# Patient Record
Sex: Female | Born: 1965 | Race: White | Hispanic: No | Smoking: Former smoker
Health system: Southern US, Community
[De-identification: ages and names within clinical notes are randomized; demographics above are authoritative.]

## PROBLEM LIST (undated history)

## (undated) DIAGNOSIS — J45909 Unspecified asthma, uncomplicated: Secondary | ICD-10-CM

## (undated) DIAGNOSIS — F419 Anxiety disorder, unspecified: Secondary | ICD-10-CM

## (undated) DIAGNOSIS — Z860100 Personal history of colon polyps, unspecified: Secondary | ICD-10-CM

## (undated) DIAGNOSIS — D649 Anemia, unspecified: Secondary | ICD-10-CM

## (undated) DIAGNOSIS — K219 Gastro-esophageal reflux disease without esophagitis: Secondary | ICD-10-CM

## (undated) DIAGNOSIS — F32A Depression, unspecified: Secondary | ICD-10-CM

## (undated) DIAGNOSIS — K602 Anal fissure, unspecified: Secondary | ICD-10-CM

## (undated) DIAGNOSIS — F329 Major depressive disorder, single episode, unspecified: Secondary | ICD-10-CM

## (undated) DIAGNOSIS — Z8619 Personal history of other infectious and parasitic diseases: Secondary | ICD-10-CM

## (undated) HISTORY — DX: Personal history of colonic polyps: Z86.010

## (undated) HISTORY — DX: Gastro-esophageal reflux disease without esophagitis: K21.9

## (undated) HISTORY — DX: Personal history of colon polyps, unspecified: Z86.0100

## (undated) HISTORY — DX: Major depressive disorder, single episode, unspecified: F32.9

## (undated) HISTORY — DX: Unspecified asthma, uncomplicated: J45.909

## (undated) HISTORY — DX: Anemia, unspecified: D64.9

## (undated) HISTORY — DX: Depression, unspecified: F32.A

## (undated) HISTORY — DX: Anal fissure, unspecified: K60.2

## (undated) HISTORY — DX: Personal history of other infectious and parasitic diseases: Z86.19

## (undated) HISTORY — DX: Anxiety disorder, unspecified: F41.9

## (undated) HISTORY — PX: TONSILLECTOMY AND ADENOIDECTOMY: SHX28

## (undated) HISTORY — PX: CERVICAL POLYPECTOMY: SHX88

---

## 2012-05-04 HISTORY — PX: HEMANGIOMA EXCISION: SHX1734

## 2017-09-17 DIAGNOSIS — Z860100 Personal history of colon polyps, unspecified: Secondary | ICD-10-CM | POA: Insufficient documentation

## 2017-09-17 HISTORY — DX: Personal history of colon polyps, unspecified: Z86.0100

## 2017-11-20 ENCOUNTER — Ambulatory Visit: Payer: Self-pay | Admitting: Family Medicine

## 2017-11-21 ENCOUNTER — Encounter: Payer: Self-pay | Admitting: Family Medicine

## 2017-11-21 ENCOUNTER — Ambulatory Visit (INDEPENDENT_AMBULATORY_CARE_PROVIDER_SITE_OTHER): Payer: BC Managed Care – PPO

## 2017-11-21 ENCOUNTER — Ambulatory Visit: Payer: BC Managed Care – PPO | Admitting: Family Medicine

## 2017-11-21 VITALS — BP 120/84 | HR 93 | Temp 98.4°F | Resp 12 | Wt 144.4 lb

## 2017-11-21 DIAGNOSIS — Z111 Encounter for screening for respiratory tuberculosis: Secondary | ICD-10-CM

## 2017-11-21 DIAGNOSIS — R05 Cough: Secondary | ICD-10-CM | POA: Diagnosis not present

## 2017-11-21 DIAGNOSIS — J309 Allergic rhinitis, unspecified: Secondary | ICD-10-CM

## 2017-11-21 DIAGNOSIS — R053 Chronic cough: Secondary | ICD-10-CM

## 2017-11-21 DIAGNOSIS — F419 Anxiety disorder, unspecified: Secondary | ICD-10-CM

## 2017-11-21 MED ORDER — ALBUTEROL SULFATE HFA 108 (90 BASE) MCG/ACT IN AERS: 2.0000 | INHALATION_SPRAY | Freq: Four times a day (QID) | RESPIRATORY_TRACT | 0 refills | Status: DC | PRN

## 2017-11-21 MED ORDER — BENZONATATE 100 MG PO CAPS: 200.0000 mg | ORAL_CAPSULE | Freq: Three times a day (TID) | ORAL | 0 refills | Status: AC | PRN

## 2017-11-21 MED ORDER — PREDNISONE 20 MG PO TABS: 40.0000 mg | ORAL_TABLET | Freq: Every day | ORAL | 0 refills | Status: AC

## 2017-11-21 MED ORDER — HYDROXYZINE HCL 25 MG PO TABS: 25.0000 mg | ORAL_TABLET | Freq: Three times a day (TID) | ORAL | 1 refills | Status: DC | PRN

## 2017-11-21 NOTE — Patient Instructions (Addendum)
A few things to remember from today's visit:   Persistent cough - Plan: CBC with Differential/Platelet, DG Chest 2 View  Screening-pulmonary TB - Plan: QuantiFERON-TB Gold Plus  Nasal irrigation with saline. Stop Claritin and try Zyrtec 10 mg daily.  Albuterol inh 2 puff every 6 hours for a week then as needed for wheezing or shortness of breath.   Please be sure medication list is accurate. If a new problem present, please set up appointment sooner than planned today.

## 2017-11-21 NOTE — Progress Notes (Signed)
ACUTE VISIT  HPI:  Chief Complaint  Patient presents with  . Sinus drainage    started several months ago,  getting worse  . Cough    with clear phlem  . Panic Attack    having more frequently since moving    Mary Jimenez is a 52 y.o.female here today complaining of 5-6 months of respiratory symptoms. Symptoms started while she was in [place].  Cough is productive with clear sputum, initially it was worse in the morning but now she is coughing intermittently during the day. No associated dyspnea or wheezing but she feels her chest tightness. She denies hemoptysis, abnormal weight loss, fever, chills, or night sweats. Post tussive vomiting, mucus content. She feels like symptoms are exacerbated by postnasal drainage and sinus congestion.  Reporting vaccines as up-to-date.  She had history of asthma during childhood. Currently she is taking Claritin 10 mg daily and Flonase nasal spray.  Vaccines up to date.   Cough   This is a new problem. The current episode started more than 1 month ago. The problem has been unchanged. The problem occurs every few minutes. The cough is productive of sputum. Associated symptoms include nasal congestion, postnasal drip and rhinorrhea. Pertinent negatives include no chest pain, chills, ear congestion, ear pain, eye redness, fever, headaches, heartburn, hemoptysis, myalgias, rash, sore throat, shortness of breath, sweats, weight loss or wheezing. Risk factors for lung disease include travel. Her past medical history is significant for asthma and environmental allergies.    She has not identified exacerbating or alleviating factors.    No Hx of recent travel.  She returned from [place] about 5 months ago No sick contact. No known insect bite.  She is also complaining about worsening anxiety and insomnia. Episodes of panic attack usually in the morning, she feels palpitations and chest tightness, symptoms are better as the day  goes. She denies suicidal thoughts. She has been under some stress since she came back from [place]. Currently she is on trazodone. She is requesting something she can take as needed for panic attacks.  She needs a TB test done.  Review of Systems  Constitutional: Positive for fatigue. Negative for activity change, appetite change, chills, fever and weight loss.  HENT: Positive for congestion, postnasal drip, rhinorrhea and sinus pressure. Negative for ear pain, mouth sores, sneezing, sore throat, trouble swallowing and voice change.   Eyes: Negative for discharge, redness and itching.  Respiratory: Positive for cough and chest tightness. Negative for hemoptysis, shortness of breath and wheezing.   Cardiovascular: Positive for palpitations. Negative for chest pain and leg swelling.  Gastrointestinal: Negative for abdominal pain, diarrhea, heartburn, nausea and vomiting.  Genitourinary: Negative for decreased urine volume, dysuria and hematuria.  Musculoskeletal: Negative for joint swelling, myalgias and neck pain.  Skin: Negative for rash.  Allergic/Immunologic: Positive for environmental allergies.  Neurological: Negative for weakness, numbness and headaches.  Hematological: Negative for adenopathy.  Psychiatric/Behavioral: Positive for sleep disturbance. Negative for confusion and suicidal ideas. The patient is nervous/anxious.       Current Outpatient Medications on File Prior to Visit  Medication Sig Dispense Refill  . estradiol (CLIMARA - DOSED IN MG/24 HR) 0.025 mg/24hr patch Place 0.025 mg onto the skin 2 (two) times a week.    . fluticasone (FLONASE) 50 MCG/ACT nasal spray Place 1 spray into both nostrils daily.    Marland Kitchen. loratadine (CLARITIN) 10 MG tablet Take 10 mg by mouth daily.    .Marland Kitchen  progesterone (PROMETRIUM) 100 MG capsule Take 100 mg by mouth daily.    . traZODone (DESYREL) 50 MG tablet Take 50 mg by mouth at bedtime.     No current facility-administered medications on file  prior to visit.      Past Medical History:  Diagnosis Date  . Anxiety   . Asthma   . Depression   . GERD (gastroesophageal reflux disease)   . History of chicken pox   . History of colon polyps    Allergies  Allergen Reactions  . Pseudoephedrine Other (See Comments)    Actifed caused hallucinations as a child    Social History   Socioeconomic History  . Marital status: Married    Spouse name: Not on file  . Number of children: 2  . Years of education: Not on file  . Highest education level: Not on file  Occupational History  . Not on file  Social Needs  . Financial resource strain: Not on file  . Food insecurity:    Worry: Not on file    Inability: Not on file  . Transportation needs:    Medical: Not on file    Non-medical: Not on file  Tobacco Use  . Smoking status: Former Games developer  . Smokeless tobacco: Never Used  Substance and Sexual Activity  . Alcohol use: Yes  . Drug use: Not Currently  . Sexual activity: Yes  Lifestyle  . Physical activity:    Days per week: Not on file    Minutes per session: Not on file  . Stress: Not on file  Relationships  . Social connections:    Talks on phone: Not on file    Gets together: Not on file    Attends religious service: Not on file    Active member of club or organization: Not on file    Attends meetings of clubs or organizations: Not on file    Relationship status: Not on file  Other Topics Concern  . Not on file  Social History Narrative  . Not on file    Vitals:   11/21/17 1608  BP: 120/84  Pulse: 93  Resp: 12  Temp: 98.4 F (36.9 C)  SpO2: 100%   There is no height or weight on file to calculate BMI.   Physical Exam  Nursing note and vitals reviewed. Constitutional: She is oriented to person, place, and time. She appears well-developed. She does not appear ill. No distress.  HENT:  Head: Normocephalic and atraumatic.  Right Ear: Tympanic membrane, external ear and ear canal normal.  Left Ear:  Tympanic membrane, external ear and ear canal normal.  Nose: Rhinorrhea present. Right sinus exhibits no maxillary sinus tenderness and no frontal sinus tenderness. Left sinus exhibits no maxillary sinus tenderness and no frontal sinus tenderness.  Mouth/Throat: Oropharynx is clear and moist and mucous membranes are normal.  Hypertrophic turbinates. Postnasal drainage.  Eyes: Conjunctivae are normal.  Neck: No muscular tenderness present. No edema and no erythema present.  Cardiovascular: Normal rate and regular rhythm.  No murmur heard. Respiratory: Effort normal and breath sounds normal. No stridor. No respiratory distress.  Lymphadenopathy:       Head (right side): No submandibular adenopathy present.       Head (left side): No submandibular adenopathy present.    She has no cervical adenopathy.  Neurological: She is alert and oriented to person, place, and time. She has normal strength.  Skin: Skin is warm. No rash noted. No erythema.  Psychiatric:  Her mood appears anxious. Her affect is labile. She expresses no suicidal ideation.  Well groomed, good eye contact.    ASSESSMENT AND PLAN:   Ms. Daisy was seen today for sinus drainage, cough and panic attack.  Diagnoses and all orders for this visit:lastcb Lab Results  Component Value Date   WBC 5.3 11/21/2017   HGB 14.0 11/21/2017   HCT 41.4 11/21/2017   MCV 90.6 11/21/2017   PLT 218.0 11/21/2017    Persistent cough  We discussed possible etiologies, including allergies, infectious, and GERD among some. She is not interested in trying empiric antibiotic treatment. Benzonatate may help with cough. Because history of asthma, even though lung auscultation is negative, I recommend Albuterol inh 2 puff every 6 hours for a week then as needed for wheezing or shortness of breath.  Follow-up in 2 to 3 weeks.  -     CBC with Differential/Platelet -     DG Chest 2 View; Future -     benzonatate (TESSALON) 100 MG capsule; Take 2  capsules (200 mg total) by mouth 3 (three) times daily as needed for up to 10 days. -     albuterol (PROVENTIL HFA;VENTOLIN HFA) 108 (90 Base) MCG/ACT inhaler; Inhale 2 puffs into the lungs every 6 (six) hours as needed for wheezing or shortness of breath.  Screening-pulmonary TB -     QuantiFERON-TB Gold Plus  Anxiety disorder, unspecified type  We discussed side effects of hydroxyzine. For now no changes in trazodone. Information about [facility], recommend arranging appointment with psychotherapist.  -     hydrOXYzine (ATARAX/VISTARIL) 25 MG tablet; Take 1 tablet (25 mg total) by mouth 3 (three) times daily as needed for itching.  Allergic rhinitis, unspecified seasonality, unspecified trigger  Nasal sinus congestion could be contributing to cough. Short course of prednisone may help. She understands side effects of medication. Stop Claritin and try Zyrtec 10 mg daily. No changes in Flonase nasal spray. Nasal saline irrigations several times per day as needed.  -     predniSONE (DELTASONE) 20 MG tablet; Take 2 tablets (40 mg total) by mouth daily with breakfast for 5 days.      Teigen Parslow G. [place], MD  [facility]. [place] office.

## 2017-11-22 ENCOUNTER — Telehealth: Payer: Self-pay | Admitting: Family Medicine

## 2017-11-22 LAB — CBC WITH DIFFERENTIAL/PLATELET
BASOS PCT: 0.4 % (ref 0.0–3.0)
Basophils Absolute: 0 10*3/uL (ref 0.0–0.1)
EOS PCT: 1.8 % (ref 0.0–5.0)
Eosinophils Absolute: 0.1 10*3/uL (ref 0.0–0.7)
HCT: 41.4 % (ref 36.0–46.0)
Hemoglobin: 14 g/dL (ref 12.0–15.0)
Lymphocytes Relative: 22.6 % (ref 12.0–46.0)
Lymphs Abs: 1.2 10*3/uL (ref 0.7–4.0)
MCHC: 33.7 g/dL (ref 30.0–36.0)
MCV: 90.6 fl (ref 78.0–100.0)
MONO ABS: 0.5 10*3/uL (ref 0.1–1.0)
Monocytes Relative: 9.9 % (ref 3.0–12.0)
NEUTROS PCT: 65.3 % (ref 43.0–77.0)
Neutro Abs: 3.5 10*3/uL (ref 1.4–7.7)
Platelets: 218 10*3/uL (ref 150.0–400.0)
RBC: 4.56 Mil/uL (ref 3.87–5.11)
RDW: 13 % (ref 11.5–15.5)
WBC: 5.3 10*3/uL (ref 4.0–10.5)

## 2017-11-22 NOTE — Telephone Encounter (Unsigned)
Copied from CRM [id number]. Topic: General - Other >> Nov 22, 2017  4:00 PM Gaynelle AduPoole, Shalonda wrote: Reason for CRM:  patient is calling to state she take traZODone (DESYREL) 50 MG tablet  and she is wanting to know if she needs to stop this medication and start hydrOXYzine (ATARAX/VISTARIL) 25 MG tablet that has been prescribed.  She stated she will wait for a respond correct direction.   Please advise

## 2017-11-23 NOTE — Telephone Encounter (Signed)
Routed to PCP 

## 2017-11-23 NOTE — Telephone Encounter (Signed)
Left detailed message informing pt of update. 

## 2017-11-23 NOTE — Telephone Encounter (Signed)
Trazodone and hydroxyzine may cause drowsiness. So even though there is not major risk of interaction, she needs to be cautious because some side effects are similar.  She is not supposed to drive or engage in activities that increase her risk for injury when taking these medications.  Trazodone is usually taken at bedtime.  Thanks, BJ

## 2017-11-23 NOTE — Telephone Encounter (Signed)
Please advise 

## 2017-11-24 ENCOUNTER — Encounter: Payer: Self-pay | Admitting: Family Medicine

## 2017-11-25 LAB — QUANTIFERON-TB GOLD PLUS
NIL: 0.04 IU/mL
QuantiFERON-TB Gold Plus: NEGATIVE
TB1-NIL: 0.07 IU/mL
TB2-NIL: 0.05 IU/mL

## 2017-11-26 ENCOUNTER — Encounter: Payer: Self-pay | Admitting: Family Medicine

## 2017-12-14 ENCOUNTER — Encounter

## 2017-12-19 ENCOUNTER — Ambulatory Visit: Payer: BC Managed Care – PPO | Admitting: Family Medicine

## 2017-12-19 ENCOUNTER — Encounter: Payer: Self-pay | Admitting: Family Medicine

## 2017-12-19 VITALS — BP 120/70 | HR 89 | Temp 98.6°F | Resp 12 | Wt 151.0 lb

## 2017-12-19 DIAGNOSIS — J3089 Other allergic rhinitis: Secondary | ICD-10-CM | POA: Diagnosis not present

## 2017-12-19 DIAGNOSIS — F419 Anxiety disorder, unspecified: Secondary | ICD-10-CM | POA: Insufficient documentation

## 2017-12-19 DIAGNOSIS — G4700 Insomnia, unspecified: Secondary | ICD-10-CM | POA: Insufficient documentation

## 2017-12-19 DIAGNOSIS — N951 Menopausal and female climacteric states: Secondary | ICD-10-CM | POA: Insufficient documentation

## 2017-12-19 DIAGNOSIS — J309 Allergic rhinitis, unspecified: Secondary | ICD-10-CM | POA: Insufficient documentation

## 2017-12-19 MED ORDER — PROGESTERONE MICRONIZED 100 MG PO CAPS: 100.0000 mg | ORAL_CAPSULE | Freq: Every day | ORAL | 2 refills | Status: DC

## 2017-12-19 MED ORDER — ESTRADIOL 0.025 MG/24HR TD PTWK: 0.0250 mg | MEDICATED_PATCH | TRANSDERMAL | 2 refills | Status: DC

## 2017-12-19 NOTE — Assessment & Plan Note (Signed)
Improved. No changes since Zyrtec 10 mg daily. Continue Flonase nasal spray daily as needed. Saline nasal irrigations several times per day also recommended.

## 2017-12-19 NOTE — Assessment & Plan Note (Signed)
Since she is not sure if she still needs medication, recommend decreasing dose of trazodone from 50 mg daily to alternating between 50 and 25 mg daily for 2 weeks then trazodone 25 mg daily. Good sleep hygiene. I think it is appropriate to follow annually unless a new concern arise.

## 2017-12-19 NOTE — Assessment & Plan Note (Signed)
She feels like she is doing better and not interested in changing or adding medications. She can continue hydroxyzine 25 mg 3 times daily as needed, we discussed some side effects. She will try to decrease trazodone to 25 mg daily and monitor for changes. Instructed about warning signs. Strongly recommend psychotherapy, she is looking into this but she has had some difficulty with insurance coverage. Follow-up in a year, before if needed.

## 2017-12-19 NOTE — Assessment & Plan Note (Signed)
Problem is well controlled with current management. We discussed side effects of hormonal therapy, including thrombotic events. No changes in current management. Follow-up in a year.

## 2017-12-19 NOTE — Progress Notes (Signed)
HPI:   [name] is a 52 y.o. female, who is here today to establish care. I saw her on 11/21/2017 for acute visit.   Former PCP: She just moved from [place] 1 to 2 months ago.  Last preventive routine visit: 09/2017, gynecologic preventive visit.  Chronic medical problems: Allergic rhinitis, anxiety, menopausal hot flashes, insomnia.  Last visit she was complaining of having worsening anxiety and panic attacks. Hydroxyzine 25 mg was recommended, medication helps. Last panic attack was about 10 days ago. She denies depressed mood but she mentions that when she is anxious she does not want to get out of bed, symptoms are alleviated by "sleeping all day."  In the past she has been on Prozac 20 mg, which she took for a year.  She felt like medication was helping but she was feeling like her mood was "flat." Relaxation exercises and regular physical activity as well as a healthy diet have helped.  She lives with her husband. Her daughter and her husband moved to the area, they are expecting and she is excited about helping them with the baby.  She is on trazodone 50 mg daily, it started about 2 years ago for insomnia. Trazodone has also helped with anxiety. She states that she is not sure if she still needs to take trazodone for sleep, she is sleeping well. She denies side effect from medication.  Allergic rhinitis: Symptoms have improved greatly with Zyrtec 10 mg daily.  She discontinued loratadine. She is also using Flonase nasal spray and saline nasal irrigations. She still has some rhinorrhea, nasal congestion, and postnasal drainage. Symptoms are worse in the morning. No fever or chills.  Concerns today: Hormonal therapy refilled. Currently she is on estradiol patch 0.0 25 mg twice per week and progesterone 100 mg daily. She is tolerating medication well. She has been on hormonal treatment for about 2 years.  Last mammogram was 2 months ago,  negative. She denies vaginal bleeding or discharge. Last Pap smear 09/2017. She underwent endometrial biopsy because endometrial cells present in Pap smear.  Pathology negative for malignancy.    Review of Systems  Constitutional: Negative for activity change, appetite change, fatigue and fever.  HENT: Positive for congestion, postnasal drip, rhinorrhea and sinus pressure. Negative for mouth sores, nosebleeds and trouble swallowing.   Eyes: Negative for redness and visual disturbance.  Respiratory: Negative for cough, shortness of breath and wheezing.   Cardiovascular: Negative for chest pain, palpitations and leg swelling.  Gastrointestinal: Negative for abdominal pain, nausea and vomiting.       Negative for changes in bowel habits.  Genitourinary: Negative for decreased urine volume, difficulty urinating, dysuria and hematuria.  Allergic/Immunologic: Positive for environmental allergies.  Neurological: Negative for syncope, weakness and headaches.  Psychiatric/Behavioral: Negative for confusion, sleep disturbance and suicidal ideas. The patient is nervous/anxious.       Current Outpatient Medications on File Prior to Visit  Medication Sig Dispense Refill  . albuterol (PROVENTIL HFA;VENTOLIN HFA) 108 (90 Base) MCG/ACT inhaler Inhale 2 puffs into the lungs every 6 (six) hours as needed for wheezing or shortness of breath. 1 Inhaler 0  . cetirizine (ZYRTEC) 10 MG tablet Take 10 mg by mouth daily.    . fluticasone (FLONASE) 50 MCG/ACT nasal spray Place 1 spray into both nostrils daily.    . traZODone (DESYREL) 50 MG tablet Take 50 mg by mouth at bedtime.     No current facility-administered medications on file prior to visit.  Past Medical History:  Diagnosis Date  . Anxiety   . Asthma   . Depression   . GERD (gastroesophageal reflux disease)   . History of chicken pox   . History of colon polyps    Allergies  Allergen Reactions  . Pseudoephedrine Other (See Comments)     Actifed caused hallucinations as a child    Family History  Problem Relation Age of Onset  . Arthritis Mother   . Asthma Mother   . Depression Mother   . Hypertension Mother   . Asthma Sister   . Hypertension Sister   . Miscarriages / Stillbirths Sister   . Miscarriages / [place] Daughter   . Alcohol abuse Maternal Grandmother   . Cancer Maternal Grandmother   . Cancer Maternal Grandfather     Social History   Socioeconomic History  . Marital status: Married    Spouse name: Not on file  . Number of children: 2  . Years of education: Not on file  . Highest education level: Not on file  Occupational History  . Not on file  Social Needs  . Financial resource strain: Not on file  . Food insecurity:    Worry: Not on file    Inability: Not on file  . Transportation needs:    Medical: Not on file    Non-medical: Not on file  Tobacco Use  . Smoking status: Former Games developer  . Smokeless tobacco: Never Used  Substance and Sexual Activity  . Alcohol use: Yes  . Drug use: Not Currently  . Sexual activity: Yes  Lifestyle  . Physical activity:    Days per week: Not on file    Minutes per session: Not on file  . Stress: Not on file  Relationships  . Social connections:    Talks on phone: Not on file    Gets together: Not on file    Attends religious service: Not on file    Active member of club or organization: Not on file    Attends meetings of clubs or organizations: Not on file    Relationship status: Not on file  Other Topics Concern  . Not on file  Social History Narrative  . Not on file    Vitals:   12/19/17 1127  BP: 120/70  Pulse: 89  Resp: 12  Temp: 98.6 F (37 C)  SpO2: 98%    There is no height or weight on file to calculate BMI.   Physical Exam  Nursing note and vitals reviewed. Constitutional: She is oriented to person, place, and time. She appears well-developed. No distress.  HENT:  Head: Normocephalic and atraumatic.  Nose: Right  sinus exhibits no maxillary sinus tenderness and no frontal sinus tenderness. Left sinus exhibits no maxillary sinus tenderness and no frontal sinus tenderness.  Mouth/Throat: Oropharynx is clear and moist and mucous membranes are normal.  Clear postnasal drainage.  Eyes: Pupils are equal, round, and reactive to light. Conjunctivae are normal.  Cardiovascular: Normal rate and regular rhythm.  No murmur heard. Pulses:      Dorsalis pedis pulses are 2+ on the right side, and 2+ on the left side.  Respiratory: Effort normal and breath sounds normal. No respiratory distress.  GI: Soft. She exhibits no mass. There is no hepatomegaly. There is no tenderness.  Musculoskeletal: She exhibits no edema.  Lymphadenopathy:    She has no cervical adenopathy.  Neurological: She is alert and oriented to person, place, and time. She has normal strength. No  cranial nerve deficit. Gait normal.  Skin: Skin is warm. No rash noted. No erythema.  Psychiatric: Her mood appears anxious.  Well groomed, good eye contact.     ASSESSMENT AND PLAN:  Ms. Sherril was seen today for establish care.  Diagnoses and all orders for this visit:  Insomnia Since she is not sure if she still needs medication, recommend decreasing dose of trazodone from 50 mg daily to alternating between 50 and 25 mg daily for 2 weeks then trazodone 25 mg daily. Good sleep hygiene. I think it is appropriate to follow annually unless a new concern arise.  Anxiety disorder She feels like she is doing better and not interested in changing or adding medications. She can continue hydroxyzine 25 mg 3 times daily as needed, we discussed some side effects. She will try to decrease trazodone to 25 mg daily and monitor for changes. Instructed about warning signs. Strongly recommend psychotherapy, she is looking into this but she has had some difficulty with insurance coverage. Follow-up in a year, before if needed.  Allergic  rhinitis Improved. No changes since Zyrtec 10 mg daily. Continue Flonase nasal spray daily as needed. Saline nasal irrigations several times per day also recommended.  Hot flashes due to menopause Problem is well controlled with current management. We discussed side effects of hormonal therapy, including thrombotic events. No changes in current management. Follow-up in a year.     Alexios Keown G. [place], MD  [facility]. [place] office.

## 2017-12-19 NOTE — Patient Instructions (Addendum)
A few things to remember from today's visit:   Insomnia, unspecified type  Anxiety disorder, unspecified type  Non-seasonal allergic rhinitis, unspecified trigger  Decrease trazodone from 50 mg to 25 mg, alternating for 2 weeks then daily 25 mg. I think it is appropriate to see you annually unless your anxiety gets worse.  Please be sure medication list is accurate. If a new problem present, please set up appointment sooner than planned today.

## 2018-01-15 ENCOUNTER — Ambulatory Visit: Payer: Self-pay | Admitting: *Deleted

## 2018-01-15 NOTE — Telephone Encounter (Signed)
  Reason for Disposition . Postmenopausal vaginal bleeding  Answer Assessment - Initial Assessment Questions 1. AMOUNT: "Describe the bleeding that you are having." "How much bleeding is there?"    - SPOTTING: spotting, or pinkish / brownish mucous discharge; does not fill panti-liner or pad    - MILD:  less than 1 pad / hour; less than patient's  menstrual bleeding when she still had menstrual periods   - MODERATE: 1-2 pads / hour; small-medium blood clots (e.g., pea, grape, small coin)    - SEVERE: soaking 2 or more pads/hour for 2 or more hours; bleeding not contained by pads or continuous red blood from vagina; large blood clots (e.g., golf ball, large coin)      Spotting- red when wipes 2. ONSET: "When did the bleeding begin?" "Is it continuing now?"     Yesterday morning- tinged mucus 3. MENOPAUSE: "When was your last menstrual period?"      2 years ago 4. ABDOMINAL PAIN: "Do you have any pain?" "How bad is the pain?"  (e.g., Scale 1-10; mild, moderate, or severe)   - MILD (1-3): doesn't interfere with normal activities, abdomen soft and not tender to touch    - MODERATE (4-7): interferes with normal activities or awakens from sleep, tender to touch    - SEVERE (8-10): excruciating pain, doubled over, unable to do any normal activities      Tender spot at end of pelvis below belly button 5. BLOOD THINNERS: "Do you take any blood thinners?" (e.g., Coumadin/warfarin, Pradaxa/dabigatran, aspirin)     no 6. HORMONES: "Are you taking any hormone medications, prescription or OTC?" (e.g., birth control pills, estrogen)     estrogen patch and progesterone pills 7. CAUSE: "What do you think is causing the bleeding?" (e.g., recent gyn surgery, recent gyn procedure; known bleeding disorder, uterine cancer)       HRT, change in exercise routine, Hx cervical polyp 8. HEMODYNAMIC STATUS: "Are you weak or feeling lightheaded?" If so, ask: "Can you stand and walk normally?"       no 9. OTHER  SYMPTOMS: "What other symptoms are you having with the bleeding?" (e.g., back pain, burning with urination, fever)     no  Protocols used: VAGINAL BLEEDING - POSTMENOPAUSAL-A-AH

## 2018-01-15 NOTE — Telephone Encounter (Signed)
Noted  

## 2018-01-16 ENCOUNTER — Ambulatory Visit: Payer: BC Managed Care – PPO | Admitting: Family Medicine

## 2018-01-16 ENCOUNTER — Encounter: Payer: Self-pay | Admitting: Family Medicine

## 2018-01-16 VITALS — BP 124/80 | HR 83 | Temp 98.4°F | Resp 12 | Ht 66.0 in | Wt 153.5 lb

## 2018-01-16 DIAGNOSIS — N938 Other specified abnormal uterine and vaginal bleeding: Secondary | ICD-10-CM

## 2018-01-16 DIAGNOSIS — N951 Menopausal and female climacteric states: Secondary | ICD-10-CM | POA: Diagnosis not present

## 2018-01-16 DIAGNOSIS — R635 Abnormal weight gain: Secondary | ICD-10-CM | POA: Diagnosis not present

## 2018-01-16 DIAGNOSIS — R102 Pelvic and perineal pain: Secondary | ICD-10-CM | POA: Diagnosis not present

## 2018-01-16 LAB — CBC
HEMATOCRIT: 40.8 % (ref 36.0–46.0)
HEMOGLOBIN: 13.7 g/dL (ref 12.0–15.0)
MCHC: 33.7 g/dL (ref 30.0–36.0)
MCV: 90.4 fl (ref 78.0–100.0)
Platelets: 194 10*3/uL (ref 150.0–400.0)
RBC: 4.51 Mil/uL (ref 3.87–5.11)
RDW: 13.1 % (ref 11.5–15.5)
WBC: 5 10*3/uL (ref 4.0–10.5)

## 2018-01-16 LAB — TSH: TSH: 3.16 u[IU]/mL (ref 0.35–4.50)

## 2018-01-16 LAB — POCT URINE PREGNANCY: Preg Test, Ur: NEGATIVE

## 2018-01-16 NOTE — Assessment & Plan Note (Signed)
We discussed some side effects of hormonal therapy. She is been taking Prometrium and estradiolvpatch as instructed, has not skipped/missed medications. No changes in current management.

## 2018-01-16 NOTE — Progress Notes (Signed)
ACUTE VISIT   HPI:  Chief Complaint  Patient presents with  . Post menopausal spotting    bleeding x 2 days    Mary Jimenez is a 52 y.o. female, who is here today complaining of 2 to 3 days of vaginal bleeding, gradual onset. Getting worse. Last night she "passed a clot." No history of trauma. She is sexually active, last intercourse about 6 days ago. + Some breast tenderness, she has not noted masses or nipple discharge.  LMP 04/2016.  Mild, suprapubic pain, no radiated, and intermittent. She wonders if suprapubic pain is related to exercising, lifting weights, she has some lower abdominal discomfort after finishing exercise section.   Lower abdominal pain exacerbated by certain movements and palpation, occasionally she also feels a "twitch" around same area. No associated urinary symptoms. Negative for fever, chills, nausea, vomiting,or urinary symptoms. She has not taken OTC analgesic.  A few days ago she had an episode of nosebleed, mild and self-limited.  She denies easy bruising, gross hematuria, or blood in the stool.  She is on progesterone 100 mg daily and estradiol patch 0.0 25 mg twice per week.  She is taking medications as instructed, has not missed any of those and usually changes patch around same time. Hormonal therapy is helping with hot flashes. She denies prior history of vaginal bleeding.  Previously she has undergone endometrial biopsy due to endometrial cells present in Pap smear.  She underwent polypectomy, no complications or problems after the procedure.  She is also complaining about abnormal weight gain despite of regular exercise and a healthful diet.  She has gained about 15 pounds in the past 1 to 2 months. Negative for unusual fatigue. She has some constipation 2 days ago.   Review of Systems  Constitutional: Negative for activity change, appetite change, fatigue and fever.  HENT: Positive for nosebleeds. Negative for mouth  sores.   Eyes: Negative for redness and visual disturbance.  Respiratory: Negative for shortness of breath and wheezing.   Cardiovascular: Negative for leg swelling.  Gastrointestinal: Positive for abdominal pain. Negative for nausea and vomiting.       Negative for changes in bowel habits.  Endocrine: Negative for cold intolerance and heat intolerance.  Genitourinary: Positive for vaginal bleeding. Negative for decreased urine volume, difficulty urinating, dysuria, genital sores, hematuria and vaginal discharge.  Musculoskeletal: Negative for back pain and myalgias.  Skin: Negative for rash and wound.  Neurological: Negative for syncope, weakness and headaches.  Psychiatric/Behavioral: The patient is nervous/anxious.       Current Outpatient Medications on File Prior to Visit  Medication Sig Dispense Refill  . albuterol (PROVENTIL HFA;VENTOLIN HFA) 108 (90 Base) MCG/ACT inhaler Inhale 2 puffs into the lungs every 6 (six) hours as needed for wheezing or shortness of breath. 1 Inhaler 0  . cetirizine (ZYRTEC) 10 MG tablet Take 10 mg by mouth daily.    Marland Kitchen. estradiol (CLIMARA - DOSED IN MG/24 HR) 0.025 mg/24hr patch Place 1 patch (0.025 mg total) onto the skin 2 (two) times a week. 24 patch 2  . fluticasone (FLONASE) 50 MCG/ACT nasal spray Place 1 spray into both nostrils daily.    . progesterone (PROMETRIUM) 100 MG capsule Take 1 capsule (100 mg total) by mouth daily. 90 capsule 2  . traZODone (DESYREL) 50 MG tablet Take 50 mg by mouth at bedtime.     No current facility-administered medications on file prior to visit.      Past Medical  History:  Diagnosis Date  . Anxiety   . Asthma   . Depression   . GERD (gastroesophageal reflux disease)   . History of chicken pox   . History of colon polyps    Allergies  Allergen Reactions  . Pseudoephedrine Other (See Comments)    Actifed caused hallucinations as a child    Social History   Socioeconomic History  . Marital status:  Married    Spouse name: Not on file  . Number of children: 2  . Years of education: Not on file  . Highest education level: Not on file  Occupational History  . Not on file  Social Needs  . Financial resource strain: Not on file  . Food insecurity:    Worry: Not on file    Inability: Not on file  . Transportation needs:    Medical: Not on file    Non-medical: Not on file  Tobacco Use  . Smoking status: Former Games developer  . Smokeless tobacco: Never Used  Substance and Sexual Activity  . Alcohol use: Yes  . Drug use: Not Currently  . Sexual activity: Yes  Lifestyle  . Physical activity:    Days per week: Not on file    Minutes per session: Not on file  . Stress: Not on file  Relationships  . Social connections:    Talks on phone: Not on file    Gets together: Not on file    Attends religious service: Not on file    Active member of club or organization: Not on file    Attends meetings of clubs or organizations: Not on file    Relationship status: Not on file  Other Topics Concern  . Not on file  Social History Narrative  . Not on file    Vitals:   01/16/18 1216  BP: 124/80  Pulse: 83  Resp: 12  Temp: 98.4 F (36.9 C)  SpO2: 98%   Body mass index is 24.78 kg/m.  Wt Readings from Last 3 Encounters:  01/16/18 153 lb 8 oz (69.6 kg)  12/19/17 151 lb (68.5 kg)  11/21/17 144 lb 6 oz (65.5 kg)    Physical Exam  Nursing note and vitals reviewed. Constitutional: She is oriented to person, place, and time. She appears well-developed and well-nourished. She does not appear ill. No distress.  HENT:  Head: Normocephalic and atraumatic.  Mouth/Throat: Oropharynx is clear and moist and mucous membranes are normal.  Eyes: Conjunctivae are normal. No scleral icterus.  Neck: No thyroid mass and no thyromegaly present.  Cardiovascular: Normal rate and regular rhythm.  No murmur heard. Respiratory: Effort normal and breath sounds normal. No respiratory distress.  GI: Soft.  Bowel sounds are normal. She exhibits no distension and no mass. There is no hepatomegaly. There is no tenderness.  Genitourinary:  Genitourinary Comments: Deferred to gynecologist.  Musculoskeletal: She exhibits no edema.  Lymphadenopathy:    She has no cervical adenopathy.       Right: No supraclavicular adenopathy present.       Left: No supraclavicular adenopathy present.  Neurological: She is alert and oriented to person, place, and time. She has normal strength.  Skin: Skin is warm. No rash noted. No erythema.  Psychiatric: Her mood appears anxious.  Well groomed, good eye contact.    ASSESSMENT AND PLAN:  Mary Jimenez was seen today for post menopausal spotting.  Diagnoses and all orders for this visit:  DUB (dysfunctional uterine bleeding) Possible etiology discussed.  Benign problem  as side effect from hormonal therapy or endometrial polyp to a more serious conditions as malignancy need to be considered. Gynecology referral placed, she might need another endometrial biopsy + further work-up. Pelvic examination was not done today, she prefers to have it done at the gynecologist's office.  She was clearly instructed about warning signs. Further recommendation will be given according to lab results.  -     Ambulatory referral to Gynecology -     CBC -     TSH -     POCT urine pregnancy  Suprapubic abdominal pain Abdominal palpation was negative for pain. ?  Abdominal wall pain. Instructed about warning signs.  -     CBC  Weight gain Recommend continuing a healthful diet and regular physical activity. Further recommendation will be given according to lab results.  -     TSH   Hot flashes due to menopause We discussed some side effects of hormonal therapy. She is been taking Prometrium and estradiolvpatch as instructed, has not skipped/missed medications. No changes in current management.    Return if symptoms worsen or fail to improve.      Conan Mcmanaway G.  [place], MD  [facility]. [place] office.

## 2018-01-16 NOTE — Patient Instructions (Signed)
A few things to remember from today's visit:   DUB (dysfunctional uterine bleeding) - Plan: Ambulatory referral to Gynecology, CBC, TSH, POCT urine pregnancy  Suprapubic abdominal pain - Plan: CBC   Abnormal Uterine Bleeding Abnormal uterine bleeding means bleeding more than usual from your uterus. It can include:  Bleeding between periods.  Bleeding after sex.  Bleeding that is heavier than normal.  Periods that last longer than usual.  Bleeding after you have stopped having your period (menopause).  There are many problems that may cause this. You should see a doctor for any kind of bleeding that is not normal. Treatment depends on the cause of the bleeding. Follow these instructions at home:  Watch your condition for any changes.  Do not use tampons, douche, or have sex, if your doctor tells you not to.  Change your pads often.  Get regular well-woman exams. Make sure they include a pelvic exam and cervical cancer screening.  Keep all follow-up visits as told by your doctor. This is important. Contact a doctor if:  The bleeding lasts more than one week.  You feel dizzy at times.  You feel like you are going to throw up (nauseous).  You throw up. Get help right away if:  You pass out.  You have to change pads every hour.  You have belly (abdominal) pain.  You have a fever.  You get sweaty.  You get weak.  You passing large blood clots from your vagina. Summary  Abnormal uterine bleeding means bleeding more than usual from your uterus.  There are many problems that may cause this. You should see a doctor for any kind of bleeding that is not normal.  Treatment depends on the cause of the bleeding. This information is not intended to replace advice given to you by your health care provider. Make sure you discuss any questions you have with your health care provider. Document Released: 12/18/2008 Document Revised: 02/15/2016 Document Reviewed:  02/15/2016 Elsevier Interactive Patient Education  2017 [employer]  Please be sure medication list is accurate. If a new problem present, please set up appointment sooner than planned today.

## 2018-01-24 ENCOUNTER — Encounter: Payer: Self-pay | Admitting: Gynecology

## 2018-01-24 ENCOUNTER — Ambulatory Visit: Payer: BC Managed Care – PPO | Admitting: Gynecology

## 2018-01-24 VITALS — BP 114/74 | Ht 69.0 in | Wt 156.0 lb

## 2018-01-24 DIAGNOSIS — N950 Postmenopausal bleeding: Secondary | ICD-10-CM

## 2018-01-24 DIAGNOSIS — Z79890 Hormone replacement therapy (postmenopausal): Secondary | ICD-10-CM

## 2018-01-24 NOTE — Progress Notes (Signed)
    Mary Jimenez [birth year] [id number]        52 y.o.  [id number] new patient presents complaining of postmenopausal bleeding.  It has been approximately 2 years since her LMP.  Currently on Climara 0.025 patch and Prometrium 100 mg nightly.  Had annual exam in [place] in July where cervical polyp was noted and subsequently biopsied off.  Endometrial biopsy showed benign endometrial tissue with some histiocytes.  The endocervical polyp was benign.  Pap smear at that time was negative.  She was not having bleeding at that time but this was found on routine exam.  Currently up-to-date with mammography and colonoscopy.  Past medical history,surgical history, problem list, medications, allergies, family history and social history were all reviewed and documented in the EPIC chart.  Directed ROS with pertinent positives and negatives documented in the history of present illness/assessment and plan.  Exam: Kennon PortelaKim Gardner assistant Vitals:   01/24/18 0855  Weight: 156 lb (70.8 kg)  Height: 5\' 9"  (1.753 m)   General appearance:  Normal Abdomen soft nontender without masses guarding rebound Pelvic external BUS vagina normal.  Cervix normal.  Uterus normal size midline mobile nontender.  Adnexa without masses or tenderness.  Assessment/Plan:  52 y.o. Mary Jimenez with episode of menses like bleeding on HRT.  Differential to include escape ovulation, hormonal dysfunction, HRT related, hyperplastic, structural such as polyps and uterine cancer all reviewed.  Given total scenario cancer/significant pathology low with biopsy in July negative.  Did not rule out possibility of endometrial polyps.  Recommend starting with sonohysterogram for pelvic surveillance/endometrial assessment.  We also discussed HRT in detail to include various studies including WHI with increased risk of thrombosis such as stroke heart attack DVT in the breast cancer issue.  Benefits to include symptom relief, cardiovascular, bone health  and decrease colon cancer risk all reviewed.  At this point the patient is going to continue on her HRT regiment and follow-up for the sonohysterogram.  I spent a total of 30 face-to-face minutes with the patient, over 50% was spent counseling and coordination of care.     Dara Lordsimothy P Jaxn Chiquito MD, 9:16 AM 01/24/2018

## 2018-01-24 NOTE — Patient Instructions (Signed)
Follow-up for the ultrasound as scheduled. 

## 2018-01-28 ENCOUNTER — Other Ambulatory Visit: Payer: Self-pay | Admitting: Gynecology

## 2018-01-28 DIAGNOSIS — N950 Postmenopausal bleeding: Secondary | ICD-10-CM

## 2018-01-31 ENCOUNTER — Encounter: Payer: Self-pay | Admitting: Gynecology

## 2018-02-05 NOTE — Telephone Encounter (Signed)
The findings on her endometrial biopsy are fairly nonspecific.  My understanding by looking at the chart is she was screened for TB and negative.  I think waiting until the ninth is okay

## 2018-02-11 ENCOUNTER — Ambulatory Visit (INDEPENDENT_AMBULATORY_CARE_PROVIDER_SITE_OTHER): Payer: BC Managed Care – PPO

## 2018-02-11 ENCOUNTER — Encounter: Payer: Self-pay | Admitting: Gynecology

## 2018-02-11 ENCOUNTER — Ambulatory Visit: Payer: BC Managed Care – PPO | Admitting: Gynecology

## 2018-02-11 VITALS — BP 122/80

## 2018-02-11 DIAGNOSIS — N950 Postmenopausal bleeding: Secondary | ICD-10-CM

## 2018-02-11 NOTE — Patient Instructions (Signed)
Office will call you with biopsy results 

## 2018-02-11 NOTE — Progress Notes (Signed)
    Mary Jimenez [birth year] [id number]        52 y.o.  [id number] presents for sonohysterogram.  History of 2 years without menses and then had 2 separate bleeding episodes in November.  On Climara patch 0.025 and Prometrium 100 mg nightly.  Reports that she underwent hormonal testing when she went through menopause several years ago which showed that she was in menopause.  Past medical history,surgical history, problem list, medications, allergies, family history and social history were all reviewed and documented in the EPIC chart.  Directed ROS with pertinent positives and negatives documented in the history of present illness/assessment and plan.  Exam: [name] assistant Vitals:   02/11/18 1633  BP: 122/80   General appearance:  Normal Abdomen soft nontender without masses guarding rebound Pelvic external BUS vagina normal.  Cervix normal.  Uterus normal size midline mobile nontender.  Adnexa without masses or tenderness.  Ultrasound shows uterus normal size and echotexture.  Endometrial echo 5.2 mm.  Right ovary with thick walled involuting type cyst 15 mm.  Thin-walled cyst 22 x 24 mm.  Both areas negative color flow Doppler.  Left ovary normal.  Cul-de-sac with 35 x 12 mm of fluid.  Sonohysterogram performed, sterile technique, easy catheter introduction, good distention with no abnormality seen.  Endometrial biopsy taken.  Patient tolerated well.  Assessment/Plan:  52 y.o. [id number] with 2 episodes of bleeding in November on HRT after no bleeding for about 2 years.  Right ovary with questionable corpus luteal type cyst.  I reviewed with the patient and her husband possibilities to include escape ovulation versus breakthrough bleeding.  She did have histiocytes on an endometrial biopsy done in [place] although was screened for TB and negative.  She will follow-up for biopsy results done today in several days.  We discussed various options to include continuing HRT, stopping for 2  weeks monitoring symptoms and checking FSH or changing regimens.  If indeed in the escape ovulation possibilities of pregnancy also discussed although unlikely.  Patient will follow-up for biopsy results and then will go from there.    Dara Lordsimothy P Laquinta Hazell MD, 4:55 PM 02/11/2018

## 2018-02-15 ENCOUNTER — Encounter: Payer: Self-pay | Admitting: Family Medicine

## 2018-02-18 ENCOUNTER — Other Ambulatory Visit: Payer: Self-pay | Admitting: Family Medicine

## 2018-02-18 MED ORDER — TRAZODONE HCL 50 MG PO TABS: 25.0000 mg | ORAL_TABLET | Freq: Every evening | ORAL | 1 refills | Status: DC | PRN

## 2018-05-02 ENCOUNTER — Encounter: Payer: Self-pay | Admitting: Gynecology

## 2018-05-02 ENCOUNTER — Encounter: Payer: Self-pay | Admitting: Family Medicine

## 2018-05-02 ENCOUNTER — Other Ambulatory Visit: Payer: Self-pay | Admitting: Gynecology

## 2018-05-02 DIAGNOSIS — N951 Menopausal and female climacteric states: Secondary | ICD-10-CM

## 2018-05-02 NOTE — Telephone Encounter (Signed)
Dr. Augustin Schooling saw you in Oct as new patient. Did not appear to be a CE.  Not clear when she should return for CE and if okay to refill this progesterone.

## 2018-05-02 NOTE — Telephone Encounter (Signed)
Okay to refill both estrogen and progesterone through July when she should be due for her annual exam

## 2018-05-03 MED ORDER — PROGESTERONE MICRONIZED 100 MG PO CAPS: 100.0000 mg | ORAL_CAPSULE | Freq: Every day | ORAL | 1 refills | Status: DC

## 2018-05-03 MED ORDER — ESTRADIOL 0.025 MG/24HR TD PTWK: 0.0250 mg | MEDICATED_PATCH | TRANSDERMAL | 1 refills | Status: DC

## 2018-05-19 ENCOUNTER — Encounter: Payer: Self-pay | Admitting: Gynecology

## 2018-05-19 ENCOUNTER — Other Ambulatory Visit: Payer: Self-pay | Admitting: Family Medicine

## 2018-05-19 DIAGNOSIS — R05 Cough: Secondary | ICD-10-CM

## 2018-05-19 DIAGNOSIS — R053 Chronic cough: Secondary | ICD-10-CM

## 2018-05-21 MED ORDER — ALBUTEROL SULFATE HFA 108 (90 BASE) MCG/ACT IN AERS: 2.0000 | INHALATION_SPRAY | Freq: Four times a day (QID) | RESPIRATORY_TRACT | 0 refills | Status: DC | PRN

## 2018-07-03 ENCOUNTER — Encounter: Payer: Self-pay | Admitting: Gynecology

## 2018-07-08 NOTE — Telephone Encounter (Signed)
If her symptoms are not that bothersome I would do nothing at this point.  I see that she has a prescription for trazodone to help with sleep from Dr. [place].  If she feels her symptoms are more significant we could try increasing her estrogen patch to 0.05 from the 0.025 and if the symptoms improved and it was due to lower estrogen.  But again at this point I think if her symptoms are not that significant then I would monitor and see if they do not resolve on their own when she gets back into a more regular routine and the COVID 19 issue lessons.

## 2018-07-31 ENCOUNTER — Other Ambulatory Visit: Payer: Self-pay | Admitting: Family Medicine

## 2018-08-19 ENCOUNTER — Encounter: Payer: Self-pay | Admitting: Family Medicine

## 2018-10-30 ENCOUNTER — Encounter: Payer: Self-pay | Admitting: Gynecology

## 2018-10-30 ENCOUNTER — Encounter: Payer: Self-pay | Admitting: Family Medicine

## 2018-10-30 ENCOUNTER — Other Ambulatory Visit: Payer: Self-pay

## 2018-10-31 ENCOUNTER — Other Ambulatory Visit: Payer: Self-pay

## 2018-10-31 DIAGNOSIS — N951 Menopausal and female climacteric states: Secondary | ICD-10-CM

## 2018-10-31 MED ORDER — PROGESTERONE MICRONIZED 100 MG PO CAPS: 100.0000 mg | ORAL_CAPSULE | Freq: Every day | ORAL | 0 refills | Status: DC

## 2018-11-04 ENCOUNTER — Other Ambulatory Visit: Payer: Self-pay

## 2018-11-04 DIAGNOSIS — N951 Menopausal and female climacteric states: Secondary | ICD-10-CM

## 2018-11-04 MED ORDER — ESTRADIOL 0.025 MG/24HR TD PTWK: 0.0250 mg | MEDICATED_PATCH | TRANSDERMAL | 0 refills | Status: DC

## 2018-11-06 ENCOUNTER — Telehealth: Payer: Self-pay | Admitting: *Deleted

## 2018-11-06 NOTE — Telephone Encounter (Signed)
Tried calling patient to schedule virtual appointment for today per Dr. [place]. Left message for patient to call office ASAP. [facility] may schedule if patient call back.

## 2018-11-27 ENCOUNTER — Encounter: Payer: Self-pay | Admitting: Gynecology

## 2019-01-21 ENCOUNTER — Other Ambulatory Visit: Payer: Self-pay

## 2019-01-21 ENCOUNTER — Encounter: Payer: Self-pay | Admitting: Family Medicine

## 2019-01-21 ENCOUNTER — Ambulatory Visit (INDEPENDENT_AMBULATORY_CARE_PROVIDER_SITE_OTHER): Payer: BC Managed Care – PPO | Admitting: Family Medicine

## 2019-01-21 ENCOUNTER — Encounter: Payer: BC Managed Care – PPO | Admitting: Family Medicine

## 2019-01-21 VITALS — BP 118/60 | HR 74 | Temp 97.2°F | Resp 12 | Ht 69.0 in | Wt 165.2 lb

## 2019-01-21 DIAGNOSIS — Z13228 Encounter for screening for other metabolic disorders: Secondary | ICD-10-CM

## 2019-01-21 DIAGNOSIS — D509 Iron deficiency anemia, unspecified: Secondary | ICD-10-CM

## 2019-01-21 DIAGNOSIS — R140 Abdominal distension (gaseous): Secondary | ICD-10-CM

## 2019-01-21 DIAGNOSIS — E559 Vitamin D deficiency, unspecified: Secondary | ICD-10-CM

## 2019-01-21 DIAGNOSIS — Z130 Encounter for screening for diseases of the blood and blood-forming organs and certain disorders involving the immune mechanism: Secondary | ICD-10-CM | POA: Diagnosis not present

## 2019-01-21 DIAGNOSIS — Z13220 Encounter for screening for lipoid disorders: Secondary | ICD-10-CM | POA: Diagnosis not present

## 2019-01-21 DIAGNOSIS — Z1329 Encounter for screening for other suspected endocrine disorder: Secondary | ICD-10-CM | POA: Diagnosis not present

## 2019-01-21 DIAGNOSIS — E538 Deficiency of other specified B group vitamins: Secondary | ICD-10-CM

## 2019-01-21 DIAGNOSIS — F419 Anxiety disorder, unspecified: Secondary | ICD-10-CM

## 2019-01-21 DIAGNOSIS — Z0000 Encounter for general adult medical examination without abnormal findings: Secondary | ICD-10-CM

## 2019-01-21 DIAGNOSIS — G4700 Insomnia, unspecified: Secondary | ICD-10-CM

## 2019-01-21 LAB — LIPID PANEL
Cholesterol: 172 mg/dL (ref 0–200)
HDL: 60 mg/dL (ref >39.00)
LDL Cholesterol: 98 mg/dL (ref 0–99)
NonHDL: 112.12
Total CHOL/HDL Ratio: 3
Triglycerides: 69 mg/dL (ref 0.0–149.0)
VLDL: 13.8 mg/dL (ref 0.0–40.0)

## 2019-01-21 LAB — COMPREHENSIVE METABOLIC PANEL
ALT: 11 U/L (ref 0–35)
AST: 14 U/L (ref 0–37)
Albumin: 4.7 g/dL (ref 3.5–5.2)
Alkaline Phosphatase: 54 U/L (ref 39–117)
BUN: 13 mg/dL (ref 6–23)
CO2: 30 mEq/L (ref 19–32)
Calcium: 9.7 mg/dL (ref 8.4–10.5)
Chloride: 104 mEq/L (ref 96–112)
Creatinine, Ser: 0.8 mg/dL (ref 0.40–1.20)
GFR: 75.07 mL/min (ref >60.00)
Glucose, Bld: 96 mg/dL (ref 70–99)
Potassium: 4.2 mEq/L (ref 3.5–5.1)
Sodium: 140 mEq/L (ref 135–145)
Total Bilirubin: 0.5 mg/dL (ref 0.2–1.2)
Total Protein: 6.6 g/dL (ref 6.0–8.3)

## 2019-01-21 LAB — CBC
HCT: 41 % (ref 36.0–46.0)
Hemoglobin: 13.6 g/dL (ref 12.0–15.0)
MCHC: 33.1 g/dL (ref 30.0–36.0)
MCV: 90.8 fl (ref 78.0–100.0)
Platelets: 194 10*3/uL (ref 150.0–400.0)
RBC: 4.52 Mil/uL (ref 3.87–5.11)
RDW: 12.9 % (ref 11.5–15.5)
WBC: 4.4 10*3/uL (ref 4.0–10.5)

## 2019-01-21 LAB — VITAMIN D 25 HYDROXY (VIT D DEFICIENCY, FRACTURES): VITD: 64.75 ng/mL (ref 30.00–100.00)

## 2019-01-21 LAB — FERRITIN: Ferritin: 20.7 ng/mL (ref 10.0–291.0)

## 2019-01-21 LAB — VITAMIN B12: Vitamin B-12: 1031 pg/mL — ABNORMAL HIGH (ref 211–911)

## 2019-01-21 LAB — HEMOGLOBIN A1C: Hgb A1c MFr Bld: 5.1 % (ref 4.6–6.5)

## 2019-01-21 MED ORDER — TRAZODONE HCL 50 MG PO TABS: ORAL_TABLET | ORAL | 1 refills | Status: DC

## 2019-01-21 NOTE — Progress Notes (Signed)
HPI:   Mary Jimenez is a 53 y.o. female, who is here today for her routine physical.  Last CPE: 2017.  Regular exercise 3 or more time per week: Not as much as she did before COVID 19 pandemia started. Following a healthy diet: Has increased alcohol intake and for a few months she was cooking "comfort food." She trying to go back to a healthier diet. She is vegan. She lives with her husband. Her 2 children moved to the area right after COVID 19 started.  Chronic medical problems: Anxiety,depression,insomnia, and allergies. Insomnia: She is on Trazodone 50 mg at bedtime. She tried to stop but she could not sleep. It is also helping with anxiety.  Pap smear: 04/2017 Follows with gyn. Dr Phineas Real.  There is no immunization history on file for this patient.  Mammogram: 10/02/17. Colonoscopy: 11/18/2017/ DEXA: N/A  She has some concerns today.  Postnasal drainage worse in the morning + non productive cough. Negative for associated sore throat,dyspnea,wheezing. She takes OTC cetirizine , which helps some.  -A lot of gas and indigestion, worse with food intake. She has had intermittently for long time. Bloated and full sensation. Negative for associated nausea,vomiting,blood in stool,or melena. Soft stool 1-2 times per week, this is a change in bowel habits but mentions that she had similar symptoms years ago [place].  She has hx of hepatic hemangioma, s/p surgical treatment. She is concerned about recurrence. Also concerned because her waist circumference has increased.  She has had H. Pylori test,negative. She denies heartburn and acid reflux.Marland Kitchen  She would like" vitamins" check.  Reporting Hx of B12 deficiency, she is on B12 supplementation, 400 mcg daily.  Vit D deficiency, she is on Vit D 10,000 U 3 times per week.  She also would like iron check, hx of iron deficiency. She is not on iron supplementation.  Review of  Systems  Constitutional: Positive for fatigue. Negative for activity change, appetite change, chills and fever.  HENT: Negative for hearing loss, mouth sores and trouble swallowing.   Eyes: Negative for redness and visual disturbance.  Respiratory: Negative for cough and wheezing.   Cardiovascular: Negative for chest pain, palpitations and leg swelling.  Gastrointestinal: Negative for abdominal pain, nausea and vomiting.  Endocrine: Negative for cold intolerance, heat intolerance, polydipsia, polyphagia and polyuria.  Genitourinary: Negative for decreased urine volume, dysuria, hematuria, vaginal bleeding and vaginal discharge.  Musculoskeletal: Negative for gait problem and myalgias.  Skin: Negative for color change and rash.  Allergic/Immunologic: Positive for environmental allergies.  Neurological: Negative for syncope, weakness and headaches.  Hematological: Negative for adenopathy. Does not bruise/bleed easily.  Psychiatric/Behavioral: Negative for confusion and sleep disturbance. The patient is nervous/anxious.   All other systems reviewed and are negative.   Current Outpatient Medications on File Prior to Visit  Medication Sig Dispense Refill  . albuterol (PROVENTIL HFA;VENTOLIN HFA) 108 (90 Base) MCG/ACT inhaler Inhale 2 puffs into the lungs every 6 (six) hours as needed for wheezing or shortness of breath. 1 Inhaler 0  . cetirizine (ZYRTEC) 10 MG tablet Take 10 mg by mouth daily.    Marland Kitchen estradiol (CLIMARA - DOSED IN MG/24 HR) 0.025 mg/24hr patch Place 1 patch (0.025 mg total) onto the skin 2 (two) times a week. Needs to schedule annual exam to avoid interruption in refills. Was due in July. 24 patch 0  . fluticasone (FLONASE) 50 MCG/ACT nasal spray Place 1 spray into both nostrils  daily.    . progesterone (PROMETRIUM) 100 MG capsule Take 1 capsule (100 mg total) by mouth daily. 90 capsule 0   No current facility-administered medications on file prior to visit.      Past Medical  History:  Diagnosis Date  . Anxiety   . Asthma   . Depression   . GERD (gastroesophageal reflux disease)   . History of chicken pox   . History of colon polyps     Past Surgical History:  Procedure Laterality Date  . CERVICAL POLYPECTOMY    . HEMANGIOMA EXCISION  05/2012   liver hemangeoma removed and partial liver  . TONSILLECTOMY AND ADENOIDECTOMY      Allergies  Allergen Reactions  . Pseudoephedrine Other (See Comments)    Actifed caused hallucinations as a child  . Latex Itching    Family History  Problem Relation Age of Onset  . Arthritis Mother   . Asthma Mother   . Depression Mother   . Hypertension Mother   . Asthma Sister   . Hypertension Sister   . Miscarriages / Stillbirths Sister   . Miscarriages / [place] Daughter   . Alcohol abuse Maternal Grandmother   . Cancer Maternal Grandmother        Lung  . Cancer Maternal Grandfather        ? type  . Cancer Maternal Aunt        Uterine    Social History   Socioeconomic History  . Marital status: Married    Spouse name: Not on file  . Number of children: 2  . Years of education: Not on file  . Highest education level: Not on file  Occupational History  . Not on file  Social Needs  . Financial resource strain: Not on file  . Food insecurity    Worry: Not on file    Inability: Not on file  . Transportation needs    Medical: Not on file    Non-medical: Not on file  Tobacco Use  . Smoking status: Former Games developer  . Smokeless tobacco: Never Used  Substance and Sexual Activity  . Alcohol use: Yes    Alcohol/week: 2.0 standard drinks    Types: 2 Standard drinks or equivalent per week  . Drug use: Never  . Sexual activity: Yes    Comment: 1st intercourse 53 yo-Fewer than 5 partners  Lifestyle  . Physical activity    Days per week: Not on file    Minutes per session: Not on file  . Stress: Not on file  Relationships  . Social Musician on phone: Not on file    Gets together: Not  on file    Attends religious service: Not on file    Active member of club or organization: Not on file    Attends meetings of clubs or organizations: Not on file    Relationship status: Not on file  Other Topics Concern  . Not on file  Social History Narrative  . Not on file     Vitals:   01/21/19 1136  BP: 118/60  Pulse: 74  Resp: 12  Temp: (!) 97.2 F (36.2 C)  SpO2: 98%   Body mass index is 24.4 kg/m.   Wt Readings from Last 3 Encounters:  01/21/19 165 lb 3.2 oz (74.9 kg)  01/24/18 156 lb (70.8 kg)  01/16/18 153 lb 8 oz (69.6 kg)    Physical Exam  Nursing note and vitals reviewed. Constitutional: She is oriented  to person, place, and time. She appears well-developed and well-nourished. No distress.  HENT:  Head: Normocephalic and atraumatic.  Right Ear: Hearing, tympanic membrane, external ear and ear canal normal.  Left Ear: Hearing, tympanic membrane, external ear and ear canal normal.  Mouth/Throat: Uvula is midline, oropharynx is clear and moist and mucous membranes are normal.  Eyes: Pupils are equal, round, and reactive to light. Conjunctivae and EOM are normal.  Neck: No tracheal deviation present. No thyromegaly present.  Cardiovascular: Normal rate and regular rhythm.  No murmur heard. Pulses:      Dorsalis pedis pulses are 2+ on the right side and 2+ on the left side.       Posterior tibial pulses are 2+ on the right side and 2+ on the left side.  Respiratory: Effort normal and breath sounds normal. No respiratory distress.  GI: Soft. She exhibits no mass. There is no hepatomegaly. There is no abdominal tenderness.  Genitourinary:    Genitourinary Comments: Deferred to gyn.   Musculoskeletal:        General: No edema.     Comments: No major deformity or signs of synovitis appreciated.  Lymphadenopathy:    She has no cervical adenopathy.       Right: No supraclavicular adenopathy present.       Left: No supraclavicular adenopathy present.   Neurological: She is alert and oriented to person, place, and time. She has normal strength. No cranial nerve deficit. Coordination and gait normal.  Reflex Scores:      Bicep reflexes are 2+ on the right side and 2+ on the left side.      Patellar reflexes are 2+ on the right side and 2+ on the left side. Skin: Skin is warm. No rash noted. No erythema.  Psychiatric: She has a normal mood and affect. Her speech is normal.  Well groomed, good eye contact.    ASSESSMENT AND PLAN:  [name] was here today annual physical examination.   Orders Placed This Encounter  Procedures  . Comprehensive metabolic panel  . Lipid panel  . Hemoglobin A1c  . CBC  . Vitamin B12  . VITAMIN D 25 Hydroxy (Vit-D Deficiency, Fractures)  . Ferritin   Lab Results  Component Value Date   WBC 4.4 01/21/2019   HGB 13.6 01/21/2019   HCT 41.0 01/21/2019   MCV 90.8 01/21/2019   PLT 194.0 01/21/2019   Lab Results  Component Value Date   HGBA1C 5.1 01/21/2019   Lab Results  Component Value Date   CREATININE 0.80 01/21/2019   BUN 13 01/21/2019   NA 140 01/21/2019   K 4.2 01/21/2019   CL 104 01/21/2019   CO2 30 01/21/2019   Lab Results  Component Value Date   ALT 11 01/21/2019   AST 14 01/21/2019   ALKPHOS 54 01/21/2019   BILITOT 0.5 01/21/2019   Lab Results  Component Value Date   CHOL 172 01/21/2019   HDL 60.00 01/21/2019   LDLCALC 98 01/21/2019   TRIG 69.0 01/21/2019   CHOLHDL 3 01/21/2019    Routine general medical examination at a health care facility We discussed the importance of regular physical activity and healthy diet for prevention of chronic illness and/or complications. Preventive guidelines reviewed. Vaccination up to date.  Ca++ and vit D supplementation to continue. Next CPE in a year.  The 10-year ASCVD risk score Denman George DC Jr., et al., 2013) is: 1%   Values used to calculate the score:  Age: [age] years     Sex: Female     Is Non-Hispanic African  American: No     Diabetic: No     Tobacco smoker: No     Systolic Blood Pressure: 118 mmHg     Is BP treated: No     HDL Cholesterol: 60 mg/dL     Total Cholesterol: 172 mg/dL  Vitamin B14B12 deficiency Continue B12 supplementation, dose will be adjusted according to B12 results.  Iron deficiency anemia, unspecified iron deficiency anemia type Further recommendations will be given according to lab results.  Vitamin D deficiency, unspecified No changes in current management, will follow labs done today and will give further recommendations accordingly.  Screening for lipoid disorders -     Lipid panel  Screening for endocrine, metabolic and immunity disorder -     Comprehensive metabolic panel -     Hemoglobin A1c  Abdominal bloating ? IBS. She prefers to hold on GI evaluation and imaging. Recommend gluten free diet for a few weeks and if not better she can try lactose free diet.  Insomnia, unspecified type Problem is well controlled with current management. Good sleep hygiene. No changes in current management.  -     traZODone (DESYREL) 50 MG tablet; TAKE 1/2 TO 1 TABLET(25 TO 50 MG) BY MOUTH AT BEDTIME AS NEEDED FOR SLEEP  Anxiety disorder, unspecified type Stable. Continue Trazodone 50 mg daily.  -     traZODone (DESYREL) 50 MG tablet; TAKE 1/2 TO 1 TABLET(25 TO 50 MG) BY MOUTH AT BEDTIME AS NEEDED FOR SLEEP    Return in 6 months (on 07/21/2019).    G. [place], MD  [facility]. [place] office.

## 2019-01-21 NOTE — Patient Instructions (Addendum)
Today you have you routine preventive visit. A few things to remember from today's visit:   Routine general medical examination at a health care facility  Vitamin B12 deficiency - Plan: Vitamin B12  Iron deficiency anemia, unspecified iron deficiency anemia type - Plan: CBC, Ferritin  Vitamin D deficiency, unspecified - Plan: VITAMIN D 25 Hydroxy (Vit-D Deficiency, Fractures)  Screening for lipoid disorders - Plan: Lipid panel  Screening for endocrine, metabolic and immunity disorder - Plan: Comprehensive metabolic panel, Hemoglobin A1c  Abdominal bloating   Please be sure medication list is accurate. If a new problem present, please set up appointment sooner than planned today.        Abdominal Bloating When you have abdominal bloating, your abdomen may feel full, tight, or painful. It may also look bigger than normal or swollen (distended). Common causes of abdominal bloating include:  Swallowing air.  Constipation.  Problems digesting food.  Eating too much.  Irritable bowel syndrome. This is a condition that affects the large intestine.  Lactose intolerance. This is an inability to digest lactose, a natural sugar in dairy products.  Celiac disease. This is a condition that affects the ability to digest gluten, a protein found in some grains.  Gastroparesis. This is a condition that slows down the movement of food in the stomach and small intestine. It is more common in people with diabetes mellitus.  Gastroesophageal reflux disease (GERD). This is a digestive condition that makes stomach acid flow back into the esophagus.  Urinary retention. This means that the body is holding onto urine, and the bladder cannot be emptied all the way. Follow these instructions at home: Eating and drinking  Avoid eating too much.  Try not to swallow air while talking or eating.  Avoid eating while lying down.  Avoid these foods and drinks: ? Foods that cause gas, such as  broccoli, cabbage, cauliflower, and baked beans. ? Carbonated drinks. ? Hard candy. ? Chewing gum. Medicines  Take over-the-counter and prescription medicines only as told by your health care provider.  Take probiotic medicines. These medicines contain live bacteria or yeasts that can help digestion.  Take coated peppermint oil capsules. Activity  Try to exercise regularly. Exercise may help to relieve bloating that is caused by gas and relieve constipation. General instructions  Keep all follow-up visits as told by your health care provider. This is important. Contact a health care provider if:  You have nausea and vomiting.  You have diarrhea.  You have abdominal pain.  You have unusual weight loss or weight gain.  You have severe pain, and medicines do not help. Get help right away if:  You have severe chest pain.  You have trouble breathing.  You have shortness of breath.  You have trouble urinating.  You have darker urine than normal.  You have blood in your stools or have dark, tarry stools. Summary  Abdominal bloating means that the abdomen is swollen.  Common causes of abdominal bloating are swallowing air, constipation, and problems digesting food.  Avoid eating too much and avoid swallowing air.  Avoid foods that cause gas, carbonated drinks, hard candy, and chewing gum. This information is not intended to replace advice given to you by your health care provider. Make sure you discuss any questions you have with your health care provider. Document Released: 03/24/2016 Document Revised: 06/10/2018 Document Reviewed: 03/24/2016 Elsevier Patient Education  2020 Elsevier Inc.  At least 150 minutes of moderate exercise per week, daily brisk walking for  15-30 min is a good exercise option. Healthy diet low in saturated (animal) fats and sweets and consisting of fresh fruits and vegetables, lean meats such as fish and white chicken and whole grains.  These  are some of recommendations for screening depending of age and risk factors:   - Vaccines:  Tdap vaccine every 10 years.  Shingles vaccine recommended at age 1, could be given after 53 years of age but not sure about insurance coverage.   Pneumonia vaccines:  Prevnar 13 at 65 and Pneumovax at 43. Sometimes Pneumovax is giving earlier if history of smoking, lung disease,diabetes,kidney disease among some.    Screening for diabetes at age 88 and every 3 years.  Cervical cancer prevention:  Pap smear starts at 52 years of age and continues periodically until 53 years old in low risk women. Pap smear every 3 years between 67 and 58 years old. Pap smear every 3-5 years between women 64 and older if pap smear negative and HPV screening negative.   -Breast cancer: Mammogram: There is disagreement between experts about when to start screening in low risk asymptomatic female but recent recommendations are to start screening at 60 and not later than 53 years old , every 1-2 years and after 53 yo q 2 years. Screening is recommended until 53 years old but some women can continue screening depending of healthy issues.   Colon cancer screening: starts at 53 years old until 53 years old.  Cholesterol disorder screening at age 42 and every 3 years.  Also recommended:  1. Dental visit- Brush and floss your teeth twice daily; visit your dentist twice a year. 2. Eye doctor- Get an eye exam at least every 2 years. 3. Helmet use- Always wear a helmet when riding a bicycle, motorcycle, rollerblading or skateboarding. 4. Safe sex- If you may be exposed to sexually transmitted infections, use a condom. 5. Seat belts- Seat belts can save your live; always wear one. 6. Smoke/Carbon Monoxide detectors- These detectors need to be installed on the appropriate level of your home. Replace batteries at least once a year. 7. Skin cancer- When out in the sun please cover up and use sunscreen 15 SPF or  higher. 8. Violence- If anyone is threatening or hurting you, please tell your healthcare provider.  9. Drink alcohol in moderation- Limit alcohol intake to one drink or less per day. Never drink and drive.

## 2019-01-26 ENCOUNTER — Other Ambulatory Visit: Payer: Self-pay | Admitting: Family Medicine

## 2019-01-26 DIAGNOSIS — G4700 Insomnia, unspecified: Secondary | ICD-10-CM

## 2019-01-26 DIAGNOSIS — F419 Anxiety disorder, unspecified: Secondary | ICD-10-CM

## 2019-01-27 ENCOUNTER — Other Ambulatory Visit: Payer: Self-pay | Admitting: *Deleted

## 2019-01-27 DIAGNOSIS — N951 Menopausal and female climacteric states: Secondary | ICD-10-CM

## 2019-01-27 MED ORDER — PROGESTERONE MICRONIZED 100 MG PO CAPS: 100.0000 mg | ORAL_CAPSULE | Freq: Every day | ORAL | 0 refills | Status: DC

## 2019-01-27 MED ORDER — ESTRADIOL 0.025 MG/24HR TD PTWK: 0.0250 mg | MEDICATED_PATCH | TRANSDERMAL | 0 refills | Status: DC

## 2019-03-03 ENCOUNTER — Other Ambulatory Visit: Payer: Self-pay

## 2019-03-03 DIAGNOSIS — N951 Menopausal and female climacteric states: Secondary | ICD-10-CM

## 2019-03-12 ENCOUNTER — Telehealth: Payer: Self-pay | Admitting: *Deleted

## 2019-03-12 ENCOUNTER — Other Ambulatory Visit: Payer: Self-pay

## 2019-03-12 DIAGNOSIS — N951 Menopausal and female climacteric states: Secondary | ICD-10-CM

## 2019-03-12 MED ORDER — PROGESTERONE MICRONIZED 100 MG PO CAPS: 100.0000 mg | ORAL_CAPSULE | Freq: Every day | ORAL | 1 refills | Status: DC

## 2019-03-12 MED ORDER — ESTRADIOL 0.025 MG/24HR TD PTWK: 0.0250 mg | MEDICATED_PATCH | TRANSDERMAL | 1 refills | Status: DC

## 2019-03-12 NOTE — Telephone Encounter (Signed)
Patient annual scheduled on 04/22/19, requesting refill on progesterone 100 mg capsules and estradiol 0.025 mg patch. Rx sent.

## 2019-03-14 ENCOUNTER — Encounter (HOSPITAL_COMMUNITY): Payer: Self-pay

## 2019-03-14 ENCOUNTER — Inpatient Hospital Stay
Admission: RE | Admit: 2019-03-14 | Discharge: 2019-03-14 | Disposition: A | Discharge status: Home / Self Care | Payer: BC Managed Care – PPO | Source: Ambulatory Visit

## 2019-03-14 ENCOUNTER — Ambulatory Visit (HOSPITAL_COMMUNITY)
Admission: EM | Admit: 2019-03-14 | Discharge: 2019-03-14 | Disposition: A | Discharge status: Home / Self Care | Payer: BC Managed Care – PPO | Attending: Family Medicine | Admitting: Family Medicine

## 2019-03-14 ENCOUNTER — Telehealth: Payer: BC Managed Care – PPO | Admitting: Emergency Medicine

## 2019-03-14 DIAGNOSIS — N898 Other specified noninflammatory disorders of vagina: Secondary | ICD-10-CM

## 2019-03-14 DIAGNOSIS — R102 Pelvic and perineal pain: Secondary | ICD-10-CM

## 2019-03-14 MED ORDER — CLOBETASOL PROPIONATE 0.05 % EX OINT: 1.0000 "application " | TOPICAL_OINTMENT | Freq: Two times a day (BID) | CUTANEOUS | 0 refills | Status: DC

## 2019-03-14 NOTE — Discharge Instructions (Signed)
May take aleve 2 tabs for the cramping May use vagisil  for itching relief ( lidocaine) Use the topical steroid clobetasol 2 x a day until symptoms resolve Take a probiotic for women to normalize vaginal bacteria and Ph Expect improvement by 72 hours

## 2019-03-14 NOTE — ED Triage Notes (Signed)
Pt reports having lower abdominal cramps since last night. Pt states she put Retin-A instead of her progesterone cream.

## 2019-03-14 NOTE — Progress Notes (Signed)
Based on what you shared with me, I feel your condition warrants further evaluation and I recommend that you be seen for a face to face visit.   In this situation you need to have an actual physical exam to properly diagnose your symptoms.   Please contact your primary care physician practice to be seen. Many offices offer virtual options to be seen via video if you are not comfortable going in person to a medical facility at this time.  If you do not have a PCP, [facility] offers a free physician referral service available at [phone]. Our trained staff has the experience, knowledge and resources to put you in touch with a physician who is right for you.   You also have the option of a video visit through [email or web address]  If you are having a true medical emergency please call 911.  NOTE: If you entered your credit card information for this eVisit, you will not be charged. You may see a "hold" on your card for the $35 but that hold will drop off and you will not have a charge processed.  Your e-visit answers were reviewed by a board certified advanced clinical practitioner to complete your personal care plan.  Thank you for using e-Visits.  Approximately 5 minutes was used in reviewing the patient's chart, questionnaire, prescribing medications, and documentation.

## 2019-03-14 NOTE — ED Provider Notes (Signed)
[facility]    CSN: [id number] Arrival date & time: 03/14/19  1543      History   Chief Complaint Chief Complaint  Patient presents with  . Abdominal Cramping    HPI Mary Jimenez is a 54 y.o. female.   HPI  Patient has estrogen cream that she uses as needed.  It is in a blue tube.  She has an estrogen patch she uses daily.  She is postmenopausal. Patient has retinol cream that she uses for wrinkles.  It is also in a blue tube. Patient states she awoke in the middle the night last night and had some vaginal irritation.  It occurred to her to try using some estrogen cream.  She inadvertently filled her vaginal applicator with retinol and inserted into her vagina.  She states that she quickly realized her mistake and tried to rinse it off her skin, and jumped in the shower to rinse thoroughly.  In spite of this she has burning of the exterior labia, some mild burning with urination, and some lower abdominal sharp pain that is been there intermittently throughout the day. No bleeding.  No discharge. She is married and not concerned about sexually transmitted disease.  Past Medical History:  Diagnosis Date  . Anxiety   . Asthma   . Depression   . GERD (gastroesophageal reflux disease)   . History of chicken pox   . History of colon polyps     Patient Active Problem List   Diagnosis Date Noted  . Insomnia 12/19/2017  . Anxiety disorder 12/19/2017  . Allergic rhinitis 12/19/2017  . Hot flashes due to menopause 12/19/2017    Past Surgical History:  Procedure Laterality Date  . CERVICAL POLYPECTOMY    . HEMANGIOMA EXCISION  05/2012   liver hemangeoma removed and partial liver  . TONSILLECTOMY AND ADENOIDECTOMY      OB History    Gravida  3   Para  2   Term  2   Preterm      AB  1   Living  2     SAB      TAB  1   Ectopic      Multiple      Live Births               Home Medications    Prior to Admission medications    Medication Sig Start Date End Date Taking? Authorizing Provider  albuterol (PROVENTIL HFA;VENTOLIN HFA) 108 (90 Base) MCG/ACT inhaler Inhale 2 puffs into the lungs every 6 (six) hours as needed for wheezing or shortness of breath. 05/21/18   [place], Betty G, MD  cetirizine (ZYRTEC) 10 MG tablet Take 10 mg by mouth daily.    [provider]  clobetasol ointment (TEMOVATE) 0.05 % Apply 1 application topically 2 (two) times daily. 03/14/19   Eustace Moore, MD  estradiol (CLIMARA - DOSED IN MG/24 HR) 0.025 mg/24hr patch Place 1 patch (0.025 mg total) onto the skin 2 (two) times a week. 03/13/19   Harrington Challenger, NP  estradiol (VIVELLE-DOT) 0.025 MG/24HR  01/28/19   [provider]  fluticasone (FLONASE) 50 MCG/ACT nasal spray Place 1 spray into both nostrils daily.    [provider]  progesterone (PROMETRIUM) 100 MG capsule Take 1 capsule (100 mg total) by mouth daily. 03/12/19   Harrington Challenger, NP  traZODone (DESYREL) 50 MG tablet TAKE 1/2 TO 1 TABLET(25 TO 50 MG) BY MOUTH AT BEDTIME AS  NEEDED FOR SLEEP 01/27/19   [place], Betty G, MD    Family History Family History  Problem Relation Age of Onset  . Arthritis Mother   . Asthma Mother   . Depression Mother   . Hypertension Mother   . Asthma Sister   . Hypertension Sister   . Miscarriages / Stillbirths Sister   . Miscarriages / [place] Daughter   . Alcohol abuse Maternal Grandmother   . Cancer Maternal Grandmother        Lung  . Cancer Maternal Grandfather        ? type  . Cancer Maternal Aunt        Uterine    Social History Social History   Tobacco Use  . Smoking status: Former Research scientist (life sciences)  . Smokeless tobacco: Never Used  Substance Use Topics  . Alcohol use: Yes    Alcohol/week: 2.0 standard drinks    Types: 2 Standard drinks or equivalent per week  . Drug use: Never     Allergies   Pseudoephedrine and Latex   Review of Systems Review of Systems  Genitourinary: Positive for dysuria, pelvic  pain and vaginal pain.       Itching and irritation  Skin: Negative for color change and rash.  Psychiatric/Behavioral: The patient is nervous/anxious.      Physical Exam Triage Vital Signs ED Triage Vitals  Enc Vitals Group     BP 03/14/19 1623 (!) 145/82     Pulse Rate 03/14/19 1623 91     Resp 03/14/19 1623 15     Temp 03/14/19 1623 98.5 F (36.9 C)     Temp Source 03/14/19 1623 Oral     SpO2 03/14/19 1623 100 %     Weight --      Height --      Head Circumference --      Peak Flow --      Pain Score 03/14/19 1620 3     Pain Loc --      Pain Edu? --      Excl. in [place]? --    No data found.  Updated Vital Signs BP (!) 145/82 (BP Location: Right Arm)   Pulse 91   Temp 98.5 F (36.9 C) (Oral)   Resp 15   SpO2 100%      Physical Exam Constitutional:      General: She is not in acute distress.    Appearance: She is normal weight.     Comments: Concerned.  HENT:     Mouth/Throat:     Comments: Mask in place Cardiovascular:     Rate and Rhythm: Normal rate.  Pulmonary:     Effort: Pulmonary effort is normal. No respiratory distress.  Abdominal:     General: Abdomen is flat. There is no distension.     Tenderness: There is no abdominal tenderness.     Comments: Abdomen is benign  Genitourinary:    Comments: Exam deferred Musculoskeletal:     Cervical back: Normal range of motion.     Right lower leg: No edema.     Left lower leg: No edema.  Skin:    General: Skin is warm and dry.  Neurological:     Mental Status: She is alert. Mental status is at baseline.  Psychiatric:     Comments: Mildly anxious.      UC Treatments / Results  Labs (all labs ordered are listed, but only abnormal results are displayed) Labs Reviewed - No data to display  EKG   Radiology No results found.  Procedures Procedures (including critical care time)  Medications Ordered in UC Medications - No data to display  Initial Impression / Assessment and Plan / UC Course   I have reviewed the triage vital signs and the nursing notes.  Pertinent labs & imaging results that were available during my care of the patient were reviewed by me and considered in my medical decision making (see chart for details).     We discussed that Retin-A is a skin irritant.  The mucous membranes of the vaginal area are particularly sensitive.  They are also quick to absorb medication.  She has done everything correctly by rinsing the medication off of her skin and then coating the skin with her estrogen cream.  She is advised not to use any of the creams for a good couple of weeks.  We talked about symptom control.  I told her that she would not have any lasting effects or problems from this.  Much reassurance given. Final Clinical Impressions(s) / UC Diagnoses   Final diagnoses:  Vaginal irritation     Discharge Instructions     May take aleve 2 tabs for the cramping May use vagisil  for itching relief ( lidocaine) Use the topical steroid clobetasol 2 x a day until symptoms resolve Take a probiotic for women to normalize vaginal bacteria and Ph Expect improvement by 72 hours   ED Prescriptions    Medication Sig Dispense Auth. Provider   clobetasol ointment (TEMOVATE) 0.05 % Apply 1 application topically 2 (two) times daily. 30 g Eustace Moore, MD     PDMP not reviewed this encounter.   Eustace Moore, MD 03/14/19 2011

## 2019-03-17 ENCOUNTER — Telehealth: Payer: Self-pay | Admitting: *Deleted

## 2019-03-17 NOTE — Telephone Encounter (Addendum)
Patient called and left message on 03/14/19 that she accidentally inserted retinol cream vaginally rather than her estrogen cream that she uses as needed and started to experience vaginal burning . I called patient to check on her, I see in epic she went to urgent care on 03/14/19. I explained the office closed earlier on this day. Patient said she is doing well and no further issues.

## 2019-04-15 ENCOUNTER — Telehealth: Payer: Self-pay | Admitting: *Deleted

## 2019-04-15 NOTE — Telephone Encounter (Signed)
Patient has annual exam scheduled with you on 04/22/19, called today to ask if she can be switched to vivelle dot patch 0.025 mg patch twice weekly patch verse climara patch 0.025 mg weekly patch? Patient said the climara patch is causing a skin rash and she would prefer a small patch( reports she has taken small in past) I confirmed with the pharmacist patch size. Okay to switch to vivelle-dot patch twice weekly patch?

## 2019-04-16 NOTE — Telephone Encounter (Signed)
Yes that would be fine to switch. Thank you.

## 2019-04-17 MED ORDER — ESTRADIOL 0.025 MG/24HR TD PTTW: 1.0000 | MEDICATED_PATCH | TRANSDERMAL | 0 refills | Status: DC

## 2019-04-17 NOTE — Telephone Encounter (Signed)
Patient informed. Rx sent.  Patient was confused on why she was started on weekly patch verses twice weekly patch. I explained to patient it appears, Dr. Swazil[place] started her on climara 0.025 mg patch on 12/20/2017. And when she started as a new patient at 01/2018 Dr.Fontaine continued same patch as Dr.Jordan.

## 2019-04-22 ENCOUNTER — Encounter: Payer: BC Managed Care – PPO | Admitting: Obstetrics and Gynecology

## 2019-04-29 ENCOUNTER — Ambulatory Visit (INDEPENDENT_AMBULATORY_CARE_PROVIDER_SITE_OTHER): Payer: BC Managed Care – PPO | Admitting: Obstetrics and Gynecology

## 2019-04-29 ENCOUNTER — Encounter: Payer: Self-pay | Admitting: Obstetrics and Gynecology

## 2019-04-29 ENCOUNTER — Other Ambulatory Visit: Payer: Self-pay

## 2019-04-29 VITALS — BP 118/74 | Ht 68.0 in | Wt 163.0 lb

## 2019-04-29 DIAGNOSIS — N951 Menopausal and female climacteric states: Secondary | ICD-10-CM | POA: Diagnosis not present

## 2019-04-29 DIAGNOSIS — Z01419 Encounter for gynecological examination (general) (routine) without abnormal findings: Secondary | ICD-10-CM

## 2019-04-29 DIAGNOSIS — Z79890 Hormone replacement therapy (postmenopausal): Secondary | ICD-10-CM | POA: Diagnosis not present

## 2019-04-29 MED ORDER — ESTRADIOL 0.025 MG/24HR TD PTTW: 1.0000 | MEDICATED_PATCH | TRANSDERMAL | 3 refills | Status: DC

## 2019-04-29 MED ORDER — PROGESTERONE MICRONIZED 100 MG PO CAPS: 100.0000 mg | ORAL_CAPSULE | Freq: Every day | ORAL | 3 refills | Status: DC

## 2019-04-29 NOTE — Patient Instructions (Addendum)
Please remember to schedule your annual mammogram this year. Next Pap smear in 2022.

## 2019-04-29 NOTE — Progress Notes (Signed)
Mary Swint G. V. (Mary Jimenez) [facility] ([place]) 1965/11/17 [id number]  SUBJECTIVE:  54 y.o. [id number] female for annual routine gynecologic exam. She has no gynecologic concerns.  Current Outpatient Medications  Medication Sig Dispense Refill  . albuterol (PROVENTIL HFA;VENTOLIN HFA) 108 (90 Base) MCG/ACT inhaler Inhale 2 puffs into the lungs every 6 (six) hours as needed for wheezing or shortness of breath. 1 Inhaler 0  . cetirizine (ZYRTEC) 10 MG tablet Take 10 mg by mouth daily.    Marland Kitchen estradiol (VIVELLE-DOT) 0.025 MG/24HR Place 1 patch onto the skin 2 (two) times a week. 8 patch 0  . fluticasone (FLONASE) 50 MCG/ACT nasal spray Place 1 spray into both nostrils daily.    . progesterone (PROMETRIUM) 100 MG capsule Take 1 capsule (100 mg total) by mouth daily. 30 capsule 1  . traZODone (DESYREL) 50 MG tablet TAKE 1/2 TO 1 TABLET(25 TO 50 MG) BY MOUTH AT BEDTIME AS NEEDED FOR SLEEP 90 tablet 1   No current facility-administered medications for this visit.   Allergies: Pseudoephedrine and Latex  No LMP recorded. Patient is postmenopausal.  Past medical history,surgical history, problem list, medications, allergies, family history and social history were all reviewed and documented as reviewed in the EPIC chart.  ROS:  Feeling well. No dyspnea or chest pain on exertion.  No abdominal pain, change in bowel habits, black or bloody stools.  No urinary tract symptoms. GYN ROS: no abnormal bleeding, pelvic pain or discharge, no breast pain or new or enlarging lumps on self exam. No neurological complaints.   OBJECTIVE:  BP 118/74   Ht 5\' 8"  (1.727 m)   Wt 163 lb (73.9 kg)   BMI 24.78 kg/m  The patient appears well, alert, oriented x 3, in no distress. ENT normal.  Neck supple. No cervical or supraclavicular adenopathy or thyromegaly.  Lungs are clear, good air entry, no wheezes, rhonchi or rales. S1 and S2 normal, no murmurs, regular rate and rhythm.  Abdomen soft without tenderness, guarding, mass or organomegaly.    Neurological is normal, no focal findings.  BREAST EXAM: breasts appear normal, no suspicious masses, no skin or nipple changes or axillary nodes  PELVIC EXAM: VULVA: normal appearing vulva with no masses, tenderness or lesions, VAGINA: normal appearing vagina with normal color and discharge, no lesions, CERVIX: normal appearing cervix without discharge or lesions, UTERUS: uterus is normal size, shape, consistency and nontender, ADNEXA: normal adnexa in size, nontender and no masses  Chaperone: present during the examination  ASSESSMENT:  54 y.o. 40 here for annual gynecologic exam  PLAN:   1. Postmenopausal/HRT.  Remains on Vivelle-Dot 0.025 mg and Prometrium 100 mg nightly.  She had a vaginal bleeding episode in 02/2018 which was worked up with benign findings on ultrasound and biopsy.  Refill provided for 1 year for both the Vivelle dot and Prometrium.  She has been unable to wean without significant side effects such as hot flashes and sleep disruption.  Risks of HRT include thrombotic concerns such as heart attack, stroke, DVT, PE, increased risk of breast cancer and uterine cancer.  Continue to revisit this topic at her annual exams. 2. Pap smear 07/2017.  No significant history of abnormal Pap smears.  Next Pap smear due 2022 following the current guidelines recommending the 3 year interval. 3. Mammogram 2019.  Normal breast exam today.  She is reminded to schedule an annual mammogram this year. 4. Colonoscopy 2019.  Recommended that she follow up at the recommended interval. 5. DEXA never.  We will  plan one further in the menopause.   6. Health maintenance.  No labs today as it appears she normally has these completed with her primary care provider.    Return annually or sooner, prn.  Joseph Pierini MD  04/29/19

## 2019-05-13 ENCOUNTER — Encounter: Payer: Self-pay | Admitting: Obstetrics and Gynecology

## 2019-05-13 ENCOUNTER — Other Ambulatory Visit: Payer: Self-pay

## 2019-05-16 ENCOUNTER — Other Ambulatory Visit: Payer: Self-pay | Admitting: Family Medicine

## 2019-08-12 ENCOUNTER — Encounter: Payer: Self-pay | Admitting: Family Medicine

## 2019-08-18 ENCOUNTER — Other Ambulatory Visit: Payer: Self-pay | Admitting: Family Medicine

## 2019-08-18 DIAGNOSIS — G4700 Insomnia, unspecified: Secondary | ICD-10-CM

## 2019-08-18 DIAGNOSIS — F419 Anxiety disorder, unspecified: Secondary | ICD-10-CM

## 2019-09-02 ENCOUNTER — Ambulatory Visit: Payer: BC Managed Care – PPO | Admitting: Family Medicine

## 2019-09-11 ENCOUNTER — Telehealth: Payer: Self-pay

## 2019-09-11 NOTE — Telephone Encounter (Signed)
Post menopausal on HRT. Vivelle patch twice weekly and daily Prometrium.  Started spotting yesterday and today bleeding like a period with cramps.  She said this happened shortly after entering menopause and Dr. Velvet Bathe thought it was an "escape ovulation".  She thinks this is happening again because prior to bleeding had breast tenderness, moodiness and vag discharge like she had with ovulation.  What to recommend?

## 2019-09-12 ENCOUNTER — Other Ambulatory Visit: Payer: Self-pay

## 2019-09-12 ENCOUNTER — Encounter: Payer: Self-pay | Admitting: Family Medicine

## 2019-09-12 ENCOUNTER — Ambulatory Visit: Payer: BC Managed Care – PPO | Admitting: Family Medicine

## 2019-09-12 VITALS — BP 128/70 | HR 79 | Temp 98.1°F | Resp 12 | Ht 68.0 in | Wt 159.2 lb

## 2019-09-12 DIAGNOSIS — G4700 Insomnia, unspecified: Secondary | ICD-10-CM

## 2019-09-12 DIAGNOSIS — N938 Other specified abnormal uterine and vaginal bleeding: Secondary | ICD-10-CM

## 2019-09-12 DIAGNOSIS — E538 Deficiency of other specified B group vitamins: Secondary | ICD-10-CM

## 2019-09-12 DIAGNOSIS — F419 Anxiety disorder, unspecified: Secondary | ICD-10-CM

## 2019-09-12 DIAGNOSIS — J3089 Other allergic rhinitis: Secondary | ICD-10-CM

## 2019-09-12 DIAGNOSIS — J320 Chronic maxillary sinusitis: Secondary | ICD-10-CM

## 2019-09-12 MED ORDER — TRAZODONE HCL 50 MG PO TABS: 50.0000 mg | ORAL_TABLET | Freq: Every day | ORAL | 2 refills | Status: DC

## 2019-09-12 MED ORDER — AMOXICILLIN-POT CLAVULANATE 875-125 MG PO TABS: 1.0000 | ORAL_TABLET | Freq: Two times a day (BID) | ORAL | 0 refills | Status: AC

## 2019-09-12 NOTE — Progress Notes (Signed)
Chief Complaint  Patient presents with  . Follow-up   HPI: [name] is a 54 y.o. female, who is here today for 6 months follow up.   She was last seen on 01/21/19. No new problems sine her last visit.  Insomnia and anxiety: She is on Trazodone 50 mg, she takes 1/2 tab sometimes,most of the time 1 tab. Medication is helping with both problems. Sleeping about 8 hours when she takes medication. She ran out of medication about 4 days ago and has not slept well. She feels rested when she gets up.  Recurrent "sinus pressure" "all year long." Nasal congestion,post nasal drainage,facial pressure,and cough. Symptoms are worse in the morning. Alleviated by increasing water intake.  Negative for fever,chills,sore throat,SOB,or wheezing. Zyrtec was making symptoms worse.  She is on Flonase nasal spray.  She would like a referral to ENT. She also would like to have B12 check. B12 deficiency: last B12 elevated at 1,030 on 01/21/19, she decreased B12 frequency.  Concerned about vaginal bleeding that started today. LMP 3 years ago. Lower abdominal cramps and bloating sensation. No changes in bowel habits or urinary symptoms. She has similar problems in 2019,underwent endometrial Bx, negative for malignancy. States that she called her gyn and was instructed to monitor for a week and if still having bleeding to arrange appt.  Review of Systems  Constitutional: Negative for activity change and appetite change.  HENT: Negative for mouth sores and nosebleeds.   Eyes: Negative for redness and visual disturbance.  Cardiovascular: Negative for chest pain, palpitations and leg swelling.  Gastrointestinal: Negative for abdominal pain, nausea and vomiting.  Genitourinary: Positive for pelvic pain. Negative for decreased urine volume, dysuria, hematuria and vaginal discharge.  Allergic/Immunologic: Positive for environmental allergies.  Neurological: Negative for syncope, weakness  and headaches.  Psychiatric/Behavioral: Negative for confusion. The patient is nervous/anxious.   Rest of ROS, see pertinent positives sand negatives in HPI  Current Outpatient Medications on File Prior to Visit  Medication Sig Dispense Refill  . albuterol (PROVENTIL HFA;VENTOLIN HFA) 108 (90 Base) MCG/ACT inhaler Inhale 2 puffs into the lungs every 6 (six) hours as needed for wheezing or shortness of breath. 1 Inhaler 0  . estradiol (VIVELLE-DOT) 0.025 MG/24HR Place 1 patch onto the skin 2 (two) times a week. 24 patch 3  . fluticasone (FLONASE) 50 MCG/ACT nasal spray Place 1 spray into both nostrils daily.    . progesterone (PROMETRIUM) 100 MG capsule Take 1 capsule (100 mg total) by mouth daily. 90 capsule 3   No current facility-administered medications on file prior to visit.   Past Medical History:  Diagnosis Date  . Anxiety   . Asthma   . Depression   . History of chicken pox   . History of colon polyps    Allergies  Allergen Reactions  . Pseudoephedrine Other (See Comments)    Actifed caused hallucinations as a child  . Latex Itching    Social History   Socioeconomic History  . Marital status: Married    Spouse name: Not on file  . Number of children: 2  . Years of education: Not on file  . Highest education level: Not on file  Occupational History  . Not on file  Tobacco Use  . Smoking status: Former Games developer  . Smokeless tobacco: Never Used  Vaping Use  . Vaping Use: Never used  Substance and Sexual Activity  . Alcohol use: Yes    Alcohol/week: 2.0 standard drinks  Types: 2 Standard drinks or equivalent per week  . Drug use: Never  . Sexual activity: Yes    Comment: 1st intercourse 54 yo-Fewer than 5 partners  Other Topics Concern  . Not on file  Social History Narrative  . Not on file   Social Determinants of Health   Financial Resource Strain:   . Difficulty of Paying Living Expenses:   Food Insecurity:   . Worried About Programme researcher, broadcasting/film/video in  the Last Year:   . Barista in the Last Year:   Transportation Needs:   . Freight forwarder (Medical):   Marland Kitchen Lack of Transportation (Non-Medical):   Physical Activity:   . Days of Exercise per Week:   . Minutes of Exercise per Session:   Stress:   . Feeling of Stress :   Social Connections:   . Frequency of Communication with Friends and Family:   . Frequency of Social Gatherings with Friends and Family:   . Attends Religious Services:   . Active Member of Clubs or Organizations:   . Attends Banker Meetings:   Marland Kitchen Marital Status:     Vitals:   09/12/19 1455  BP: 128/70  Pulse: 79  Resp: 12  Temp: 98.1 F (36.7 C)  SpO2: 100%   Body mass index is 24.21 kg/m.  Physical Exam Constitutional:      General: She is not in acute distress.    Appearance: She is well-developed and normal weight. She is not ill-appearing.  HENT:     Head: Normocephalic and atraumatic.     Right Ear: Tympanic membrane, ear canal and external ear normal.     Left Ear: Tympanic membrane, ear canal and external ear normal.     Nose: Septal deviation present. No rhinorrhea.     Right Turbinates: Enlarged.     Left Turbinates: Enlarged.     Right Sinus: Maxillary sinus tenderness present. No frontal sinus tenderness.     Left Sinus: Maxillary sinus tenderness present. No frontal sinus tenderness.     Mouth/Throat:     Mouth: Mucous membranes are moist.     Pharynx: Oropharynx is clear.  Eyes:     Conjunctiva/sclera: Conjunctivae normal.     Pupils: Pupils are equal, round, and reactive to light.  Cardiovascular:     Rate and Rhythm: Normal rate and regular rhythm.     Heart sounds: No murmur heard.   Pulmonary:     Effort: Pulmonary effort is normal. No respiratory distress.     Breath sounds: Normal breath sounds. No stridor.  Abdominal:     Tenderness: There is abdominal tenderness (mild) in the suprapubic area. There is no guarding or rebound.  Musculoskeletal:      Cervical back: No edema or erythema. No muscular tenderness.     Right lower leg: No edema.     Left lower leg: No edema.  Lymphadenopathy:     Head:     Right side of head: No submandibular adenopathy.     Left side of head: No submandibular adenopathy.     Cervical: No cervical adenopathy.  Skin:    General: Skin is warm.     Findings: No erythema or rash.  Neurological:     General: No focal deficit present.     Mental Status: She is alert and oriented to person, place, and time.     Cranial Nerves: Cranial nerves are intact.     Gait: Gait normal.  Psychiatric:        Mood and Affect: Affect normal. Mood is anxious.     Comments: Well groomed, good eye contact.    ASSESSMENT AND PLAN:   Mary Jimenez was seen today for 6 months follow-up.  Orders Placed This Encounter  Procedures  . Vitamin B12  . CBC  . TSH  . Ambulatory referral to ENT   Lab Results  Component Value Date   VITAMINB12 617 09/12/2019   Lab Results  Component Value Date   TSH 2.33 09/12/2019   Lab Results  Component Value Date   WBC 5.8 09/12/2019   HGB 13.0 09/12/2019   HCT 39.9 09/12/2019   MCV 92.1 09/12/2019   PLT 211 09/12/2019   Insomnia, unspecified type Problem is well controlled. No changes in current management. Good sleep hygiene.  -     traZODone (DESYREL) 50 MG tablet; Take 1 tablet (50 mg total) by mouth at bedtime.  Non-seasonal allergic rhinitis, unspecified trigger Saline nasal irrigations as needed. Continue Flonase nasal spray. ENT referral placed as requested.  DUB (dysfunctional uterine bleeding) We discussed possible causes. Already had endometrial bx and negative. Instructed to follow gyn recommendations. Instructed about warning signs.  B12 deficiency No changes in current management, will follow B12 done today and will give further recommendations accordingly.  Maxillary sinusitis, unspecified chronicity Explained that it may not be infectious  but because persistent symptoms abx treatment recommended. Some side effects discussed. Plain mucinex may help.  -     amoxicillin-clavulanate (AUGMENTIN) 875-125 MG tablet; Take 1 tablet by mouth 2 (two) times daily for 10 days.  Anxiety disorder, unspecified type -     traZODone (DESYREL) 50 MG tablet; Take 1 tablet (50 mg total) by mouth at bedtime.    Return in about 33 weeks (around 04/30/2020) for cpe.   Savayah Waltrip G. Swaziland, MD  [facility]. [place] office.   A few things to remember from today's visit: Plain Mucinex may help. Nasal saline irrigations as needed.  If you need refills please call your pharmacy. Do not use My Chart to request refills or for acute issues that need immediate attention.    Please be sure medication list is accurate. If a new problem present, please set up appointment sooner than planned today.

## 2019-09-12 NOTE — Patient Instructions (Addendum)
A few things to remember from today's visit:   Insomnia, unspecified type  Non-seasonal allergic rhinitis, unspecified trigger - Plan: Ambulatory referral to ENT  DUB (dysfunctional uterine bleeding) - Plan: CBC, TSH  B12 deficiency - Plan: Vitamin B12  Maxillary sinusitis, unspecified chronicity - Plan: amoxicillin-clavulanate (AUGMENTIN) 875-125 MG tablet  Plain Mucinex may help. Nasal saline irrigations as needed.  If you need refills please call your pharmacy. Do not use My Chart to request refills or for acute issues that need immediate attention.    Please be sure medication list is accurate. If a new problem present, please set up appointment sooner than planned today.

## 2019-09-12 NOTE — Telephone Encounter (Signed)
Patient informed. 

## 2019-09-12 NOTE — Telephone Encounter (Signed)
Yes that is probably what it is.  If the bleeding does not stop within a week, or if it does stop but it continues to recur at least once per month, she needs to come in for evaluation.

## 2019-09-13 ENCOUNTER — Encounter: Payer: Self-pay | Admitting: Family Medicine

## 2019-09-13 LAB — CBC
HCT: 39.9 % (ref 35.0–45.0)
Hemoglobin: 13 g/dL (ref 11.7–15.5)
MCH: 30 pg (ref 27.0–33.0)
MCHC: 32.6 g/dL (ref 32.0–36.0)
MCV: 92.1 fL (ref 80.0–100.0)
MPV: 12.4 fL (ref 7.5–12.5)
Platelets: 211 10*3/uL (ref 140–400)
RBC: 4.33 10*6/uL (ref 3.80–5.10)
RDW: 12.4 % (ref 11.0–15.0)
WBC: 5.8 10*3/uL (ref 3.8–10.8)

## 2019-09-13 LAB — TSH: TSH: 2.33 mIU/L

## 2019-09-13 LAB — VITAMIN B12: Vitamin B-12: 617 pg/mL (ref 200–1100)

## 2019-10-01 ENCOUNTER — Ambulatory Visit (INDEPENDENT_AMBULATORY_CARE_PROVIDER_SITE_OTHER): Payer: BC Managed Care – PPO | Admitting: Nurse Practitioner

## 2019-10-01 ENCOUNTER — Encounter: Payer: Self-pay | Admitting: Nurse Practitioner

## 2019-10-01 ENCOUNTER — Other Ambulatory Visit: Payer: Self-pay

## 2019-10-01 VITALS — BP 124/80

## 2019-10-01 DIAGNOSIS — R232 Flushing: Secondary | ICD-10-CM

## 2019-10-01 DIAGNOSIS — N939 Abnormal uterine and vaginal bleeding, unspecified: Secondary | ICD-10-CM | POA: Diagnosis not present

## 2019-10-01 MED ORDER — MEGESTROL ACETATE 40 MG PO TABS: 40.0000 mg | ORAL_TABLET | Freq: Two times a day (BID) | ORAL | 0 refills | Status: AC

## 2019-10-01 NOTE — Patient Instructions (Signed)
Postmenopausal Bleeding  Postmenopausal bleeding is any bleeding that occurs after menopause. Menopause is when a woman's period stops. Any type of bleeding after menopause should be checked by your doctor. Treatment will depend on the cause. Follow these instructions at home:  Pay attention to any changes in your symptoms.  Avoid using tampons and douches as told by your doctor.  Change your pads regularly.  Get regular pelvic exams and Pap tests.  Take iron pills as told by your doctor.  Take over-the-counter and prescription medicines only as told by your doctor.  Keep all follow-up visits as told by your doctor. This is important. Contact a doctor if:  Your bleeding lasts for more than 1 week.  You have pain in your belly (abdomen).  You have bleeding during or after sex.  You have bleeding that happens more often than every 3 weeks. Get help right away if:  You have fever, chills, headache, dizziness, muscle aches, or bleeding.  You have very bad pain with bleeding.  You have clumps of blood (blood clots) coming from your vagina.  You have a lot of bleeding, and: ? You use more than 1 pad an hour. ? This kind of bleeding has never happened before.  You feel like you are going to pass out (faint). Summary  Any type of bleeding after menopause should be checked by your doctor.  Pay attention to any changes in your symptoms.  Keep all follow-up visits as told by your doctor. This information is not intended to replace advice given to you by your health care provider. Make sure you discuss any questions you have with your health care provider. Document Revised: 05/09/2018 Document Reviewed: 03/28/2016 Elsevier Patient Education  2020 Elsevier Inc.  

## 2019-10-01 NOTE — Progress Notes (Signed)
   Acute Office Visit  Subjective:    Patient ID: Mary Jimenez, female    DOB: January 21, 1966, 54 y.o.   MRN: [id number]   HPI 54 y.o. presents today for vaginal bleeding that began 09/11/2019. Prior to bleeding she had breast tenderness, nipple sensitivity, and changes in cervical mucous. Bleeding initially was heavy like a cycle for 6 days, stopped and returned for 5 days. She is not bleeding today. Cycles stopped in 2017. Had an episode of bleeding 02/2018 with negative ultrasound and biopsy and was suspected to have had an escaped ovulation. On vivelle patch 0.025 mg and prometrium 100 mg daily. Has tried to wean in the past but was unable to tolerate severe hot flashes and insomnia. She has had a 30 pound weight gain in the last year.   Review of Systems  Constitutional: Negative.   Gastrointestinal: Positive for abdominal pain (mid lower).  Genitourinary: Positive for vaginal bleeding.       Objective:    Physical Exam Constitutional:      Appearance: Normal appearance.  Abdominal:     General: Abdomen is flat.     Palpations: Abdomen is soft.  Genitourinary:    General: Normal vulva.     Vagina: Normal.     Cervix: Normal.     Uterus: Normal.      BP 124/80 (BP Location: Right Arm, Patient Position: Sitting, Cuff Size: Normal)   LMP 09/11/2019  Wt Readings from Last 3 Encounters:  09/12/19 159 lb 4 oz (72.2 kg)  04/29/19 163 lb (73.9 kg)  01/21/19 165 lb 3.2 oz (74.9 kg)        Assessment & Plan:   Problem List Items Addressed This Visit    None    Visit Diagnoses    Abnormal uterine bleeding    -  Primary   Relevant Medications   megestrol (MEGACE) 40 MG tablet   Other Relevant Orders   US PELVIS TRANSVAGINAL NON-OB (TV ONLY)   Hot flashes       Relevant Orders   Follicle stimulating hormone     Plan: Based on PMS symptoms she most likely had another escaped ovulation. FSH today. Will schedule ultrasound to check for polyps/endometrial thickness.  Megace 40 mg twice a day as needed for bleeding. If normal ultrasound her options are to continue with current HRT or stop HRT and switch to something like Effexor for vasomotor symptoms. She is agreeable to plan.      Olivia Mackie [facility], 4:44 PM 10/01/2019

## 2019-10-10 ENCOUNTER — Ambulatory Visit (INDEPENDENT_AMBULATORY_CARE_PROVIDER_SITE_OTHER): Payer: BC Managed Care – PPO | Admitting: Otolaryngology

## 2019-10-10 ENCOUNTER — Other Ambulatory Visit: Payer: Self-pay

## 2019-10-10 ENCOUNTER — Encounter (INDEPENDENT_AMBULATORY_CARE_PROVIDER_SITE_OTHER): Payer: Self-pay | Admitting: Otolaryngology

## 2019-10-10 VITALS — Temp 97.3°F

## 2019-10-10 DIAGNOSIS — J310 Chronic rhinitis: Secondary | ICD-10-CM | POA: Diagnosis not present

## 2019-10-10 NOTE — Progress Notes (Signed)
HPI: Mary Jimenez is a 54 y.o. female who presents is referred by Dr. Swaziland for evaluation of nasal sinus symptoms.  Patient complains of chronic postnasal drainage that she has had for years.  She has tried several medications including Flonase, Claritin, and Zyrtec.  Zyrtec seemed to help the most but then she felt like this made the mucus thicker and she had more pressure when she used the Zyrtec.Marland Kitchen  Her symptoms are significantly worse at night and when she first wakes up in the morning.  She does not have as much problems during the daytime.  She also describes swelling of her eyes in the morning along with a lot of thick mucus discharge which is generally clear.  She has had the symptoms for over 2 years. She denies any illness or fever. Has not experienced yellow-green discharge and symptoms seem to do better during the daytime.  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   TONSILLECTOMY AND ADENOIDECTOMY     Social History   Socioeconomic History   Marital status: Married    Spouse name: Not on file   Number of children: 2   Years of education: Not on file   Highest education level: Not on file  Occupational History   Not on file  Tobacco Use   Smoking status: Former Smoker    Packs/day: 1.00    Years: 7.00    Pack years: 7.00   Smokeless tobacco: Never Used   Tobacco comment: smoked off and on  Vaping Use   Vaping Use: Never used  Substance and Sexual Activity   Alcohol use: Yes    Alcohol/week: 2.0 standard drinks    Types: 2 Standard drinks or equivalent per week   Drug use: Never   Sexual activity: Yes    Comment: 1st intercourse 54 yo-Fewer than 5 partners  Other Topics Concern   Not on file  Social  History Narrative   Not on file   Social Determinants of Health   Financial Resource Strain:    Difficulty of Paying Living Expenses:   Food Insecurity:    Worried About Programme researcher, broadcasting/film/video in the Last Year:    Barista in the Last Year:   Transportation Needs:    Freight forwarder (Medical):    Lack of Transportation (Non-Medical):   Physical Activity:    Days of Exercise per Week:    Minutes of Exercise per Session:   Stress:    Feeling of Stress :   Social Connections:    Frequency of Communication with Friends and Family:    Frequency of Social Gatherings with Friends and Family:    Attends Religious Services:    Active Member of Clubs or Organizations:    Attends Engineer, structural:    Marital Status:    Family History  Problem Relation Age of Onset   Arthritis Mother    Asthma Mother    Depression Mother    Hypertension Mother    Asthma Sister    Hypertension Sister    Miscarriages / [place] Sister    Miscarriages / [place] Daughter    Alcohol abuse Maternal Grandmother  Cancer Maternal Grandmother        Lung   Cancer Maternal Grandfather        ? type   Cancer Maternal Aunt        Uterine   Allergies  Allergen Reactions   Pseudoephedrine Other (See Comments)    Actifed caused hallucinations as a child   Latex Itching   Prior to Admission medications   Medication Sig Start Date End Date Taking? Authorizing Provider  albuterol (PROVENTIL HFA;VENTOLIN HFA) 108 (90 Base) MCG/ACT inhaler Inhale 2 puffs into the lungs every 6 (six) hours as needed for wheezing or shortness of breath. 05/21/18  Yes Swaziland, Betty G, MD  estradiol (VIVELLE-DOT) 0.025 MG/24HR Place 1 patch onto the skin 2 (two) times a week. 05/01/19  Yes Theresia Majors, MD  fluticasone (FLONASE) 50 MCG/ACT nasal spray Place 1 spray into both nostrils daily.   Yes [provider]  megestrol (MEGACE) 40 MG tablet Take 1 tablet  (40 mg total) by mouth 2 (two) times daily for 10 days. 10/01/19 10/11/19 Yes Wyline Beady A, NP  progesterone (PROMETRIUM) 100 MG capsule Take 1 capsule (100 mg total) by mouth daily. 04/29/19  Yes Theresia Majors, MD  traZODone (DESYREL) 50 MG tablet Take 1 tablet (50 mg total) by mouth at bedtime. 09/12/19  Yes [place], Betty G, MD     Positive ROS: Otherwise negative  All other systems have been reviewed and were otherwise negative with the exception of those mentioned in the HPI and as above.  Physical Exam: Constitutional: Alert, well-appearing, no acute distress Ears: External ears without lesions or tenderness. Ear canals are clear bilaterally.  TMs are clear bilaterally..  Nasal: External nose without lesions.  Intranasal exam was clear on anterior rhinoscopy.  She has slight septal spurring on the left side.  Nasal endoscopy was performed and on nasal endoscopy both middle meatus regions were widely patent with no polyps noted minimal edema and only clear mucus discharge.  Posterior ethmoid area was clear as was the sphenoid.  Nasopharynx was clear. Oral: Lips and gums without lesions. Tongue and palate mucosa without lesions. Posterior oropharynx clear.  Patient is status post tonsillectomy Neck: No palpable adenopathy or masses. Respiratory: Breathing comfortably  Skin: No facial/neck lesions or rash noted.  Nasal/sinus endoscopy  Date/Time: 10/10/2019 4:46 PM Performed by: Drema Halon, MD Authorized by: Drema Halon, MD   Consent:    Consent obtained:  Verbal   Consent given by:  Patient Procedure details:    Indications: sino-nasal symptoms     Medication:  Afrin   Instrument: flexible fiberoptic nasal endoscope     Scope location: bilateral nare   Septum:    Deviation: deviated to the left and posterior deviation     Severity of deviation: mild   Sinus:    Right middle meatus: normal     Left middle meatus: normal     Right nasopharynx: normal      Left nasopharynx: normal   Comments:     On nasal endoscopy nasal passages were clear with slight left septal spur but this is nonobstructing.  There are no polyps in both middle meatus regions are clear.    Assessment: Chronic rhinitis with worse symptoms at nighttime and in the a.m.  Plan: I would recommend evaluation with an allergist. Recommended continue use of the Flonase 2 sprays each nostril at night as this will help some with congestion and postnasal drainage.  Also discussed use of saline irrigation  which she is already doing. I did prescribe azelastine 0.1% nasal spray which is an antihistamine nasal spray that she can try to see if this helps at all. But there is no structural abnormalities and no polyps within the nasal cavity that would warrant surgical intervention.   Narda Bonds, MD   CC:

## 2019-10-15 ENCOUNTER — Ambulatory Visit: Payer: BC Managed Care – PPO | Admitting: Nurse Practitioner

## 2019-10-15 ENCOUNTER — Ambulatory Visit (INDEPENDENT_AMBULATORY_CARE_PROVIDER_SITE_OTHER): Payer: BC Managed Care – PPO

## 2019-10-15 ENCOUNTER — Other Ambulatory Visit: Payer: Self-pay

## 2019-10-15 ENCOUNTER — Encounter: Payer: Self-pay | Admitting: Nurse Practitioner

## 2019-10-15 DIAGNOSIS — N939 Abnormal uterine and vaginal bleeding, unspecified: Secondary | ICD-10-CM

## 2019-10-15 DIAGNOSIS — N854 Malposition of uterus: Secondary | ICD-10-CM

## 2019-10-15 DIAGNOSIS — N8301 Follicular cyst of right ovary: Secondary | ICD-10-CM

## 2019-10-15 LAB — FOLLICLE STIMULATING HORMONE: FSH: 58 m[IU]/mL

## 2019-10-15 NOTE — Progress Notes (Signed)
History: 54 year old presents today to discuss ultrasound.  She was seen by me 10/01/2019 for vaginal bleeding that began 09/11/2019.  Prior to her bleeding episode she experienced breast tenderness, nipple sensitivity, and changes in her cervical mucus.  Initially the bleeding was heavy for 6 days, stopped and then returned for 5 days.  Cycles stopped in 2017.  She had an episode of bleeding in December 2019 with normal ultrasound and negative biopsy.  She is on Vivelle patch 0.025 mg and Prometrium 100 mg daily.  She has been unable to wean in the past due to severe hot flashes and insomnia.  Exam: Appears well Ultrasound: Anteverted uterus, normal size and shape, no myometrial masses.  Thin, symmetrical endometrium measuring 4 mm, no masses or thickening seen.  Both ovaries normal size with normal follicle pattern.  Right ovary with thin-walled cyst measuring 14 mm, cannot rule out endometrioma versus hemorrhagic cyst, avascular.  No adnexal masses, no free fluid.  Assessment/Plan: Reviewed ultrasound and reassurance provided on normal findings. Again we discussed that she most likely had an escaped ovulation based on the PMS symptoms prior to the bleeding. She would like to continue with current HRT and will return if heavy, extended bleeding occurs.  She forgot to stop at lab last visit to we will check [facility] today.  She is agreeable to plan.

## 2019-11-27 ENCOUNTER — Other Ambulatory Visit: Payer: Self-pay | Admitting: Nurse Practitioner

## 2019-11-27 DIAGNOSIS — N939 Abnormal uterine and vaginal bleeding, unspecified: Secondary | ICD-10-CM

## 2019-12-01 NOTE — Telephone Encounter (Signed)
Please check with patient to see if she is still having heavy bleeding. Megace is not something we want her to take all the time. If bleeding is still happening we need to consider adjusting her hormones or changing to a different treatment. Thank you.

## 2019-12-02 NOTE — Telephone Encounter (Signed)
Left detailed message in voice mail. Asked her to call me and let me know about her bleeding.

## 2020-01-20 ENCOUNTER — Telehealth: Payer: BC Managed Care – PPO | Admitting: Family Medicine

## 2020-02-03 ENCOUNTER — Encounter: Payer: BC Managed Care – PPO | Admitting: Family Medicine

## 2020-04-19 ENCOUNTER — Other Ambulatory Visit: Payer: Self-pay | Admitting: Obstetrics and Gynecology

## 2020-04-20 NOTE — Telephone Encounter (Signed)
Medication refill request: Vivelle-dot  Last AEX:  04-29-19 JK  Next AEX: not currently scheduled  Last MMG (if hormonal medication request): ? 2019  Refill authorized: Today, please advise.   Medication pended for #8, 0RF. Please refill if appropriate.

## 2020-04-20 NOTE — Telephone Encounter (Signed)
She is taking the Prometrium as well, right? Just want to be sure before I sign the estradiol refill

## 2020-04-21 NOTE — Telephone Encounter (Signed)
She is taking the Prometrium as well, right? Just want to be sure before I sign the estradiol refill

## 2020-04-29 NOTE — Telephone Encounter (Signed)
Attempted to reach patient. Voicemail full, unable to leave a message at this time.

## 2020-05-05 ENCOUNTER — Other Ambulatory Visit: Payer: Self-pay

## 2020-05-05 ENCOUNTER — Encounter: Payer: Self-pay | Admitting: Nurse Practitioner

## 2020-05-05 MED ORDER — ESTRADIOL 0.025 MG/24HR TD PTTW: 1.0000 | MEDICATED_PATCH | TRANSDERMAL | 0 refills | Status: DC

## 2020-05-05 NOTE — Telephone Encounter (Signed)
Patient is down to her last Estradiol patch and needs refill until her AEX visit with TW, NP on 05/11/20.

## 2020-05-11 ENCOUNTER — Ambulatory Visit: Payer: BC Managed Care – PPO | Admitting: Nurse Practitioner

## 2020-05-11 ENCOUNTER — Other Ambulatory Visit: Payer: Self-pay

## 2020-05-11 ENCOUNTER — Encounter: Payer: Self-pay | Admitting: Nurse Practitioner

## 2020-05-11 VITALS — BP 120/70 | Ht 67.0 in | Wt 167.0 lb

## 2020-05-11 DIAGNOSIS — N951 Menopausal and female climacteric states: Secondary | ICD-10-CM

## 2020-05-11 DIAGNOSIS — Z01419 Encounter for gynecological examination (general) (routine) without abnormal findings: Secondary | ICD-10-CM | POA: Diagnosis not present

## 2020-05-11 DIAGNOSIS — Z7689 Persons encountering health services in other specified circumstances: Secondary | ICD-10-CM

## 2020-05-11 DIAGNOSIS — Z79890 Hormone replacement therapy (postmenopausal): Secondary | ICD-10-CM

## 2020-05-11 MED ORDER — ESTRADIOL 0.025 MG/24HR TD PTTW: 1.0000 | MEDICATED_PATCH | TRANSDERMAL | 3 refills | Status: DC

## 2020-05-11 MED ORDER — PROGESTERONE MICRONIZED 100 MG PO CAPS: 100.0000 mg | ORAL_CAPSULE | Freq: Every day | ORAL | 3 refills | Status: DC

## 2020-05-11 MED ORDER — ESTRADIOL 0.1 MG/GM VA CREA: 1.0000 | TOPICAL_CREAM | VAGINAL | 11 refills | Status: DC

## 2020-05-11 NOTE — Progress Notes (Signed)
   Mary Jimenez [facility] [birth year] [id number]   History:  55 y.o. [id number] presents for annual exam. Postmenopausal - on HRT. Normal pap and mammogram history. PMB 10/2019 with normal pelvic ultrasound - endometrial thickness 4 mm. No bleeding since. Complains of vaginal dryness. She used vaginal estrogen in the past with good improvement and would like to restart this. She also complains of weight gain - she is vegan and has not been as active lately due to moving here and the pandemic.   Gynecologic History Patient's last menstrual period was 09/11/2019.   Contraception/Family planning: post menopausal status  Health Maintenance Last Pap: 2019. Results were: normal Last mammogram: 2019. Results were: normal Last colonoscopy: 2019. Results were: normal Last Dexa: N/A  Past medical history, past surgical history, family history and social history were all reviewed and documented in the EPIC chart.  ROS:  A ROS was performed and pertinent positives and negatives are included.  Exam:  Vitals:   05/11/20 1140  BP: 120/70  Weight: 167 lb (75.8 kg)  Height: 5\' 7"  (1.702 m)   Body mass index is 26.16 kg/m.  General appearance:  Normal Thyroid:  Symmetrical, normal in size, without palpable masses or nodularity. Respiratory  Auscultation:  Clear without wheezing or rhonchi Cardiovascular  Auscultation:  Regular rate, without rubs, murmurs or gallops  Edema/varicosities:  Not grossly evident Abdominal  Soft,nontender, without masses, guarding or rebound.  Liver/spleen:  No organomegaly noted  Hernia:  None appreciated  Skin  Inspection:  Grossly normal   Breasts: Examined lying and sitting.   Right: Without masses, retractions, discharge or axillary adenopathy.   Left: Without masses, retractions, discharge or axillary adenopathy. Gentitourinary   Inguinal/mons:  Normal without inguinal adenopathy  External genitalia:  Normal  BUS/Urethra/Skene's glands:  Normal  Vagina:   Normal  Cervix:  Normal  Uterus:  Normal in size, shape and contour.  Midline and mobile  Adnexa/parametria:     Rt: Without masses or tenderness.   Lt: Without masses or tenderness.  Anus and perineum: Normal  Digital rectal exam: Normal sphincter tone without palpated masses or tenderness  Assessment/Plan:  55 y.o. 57 for annual exam.   Well female exam with routine gynecological exam - Education provided on SBEs, importance of preventative screenings, current guidelines, high calcium diet, regular exercise, and multivitamin daily. Labs done by PCP.   Hormone replacement therapy (HRT) - Plan: estradiol (VIVELLE-DOT) 0.025 MG/24HR, progesterone (PROMETRIUM) 100 MG capsule. Doing well on this. We discussed the risks and benefits of use. She would like to continue. Refill x 1 year provided  Encounter for weight management - She has had difficulty losing weight.  She is vegan and admits to not being as active since moving here due to less walking and gym use. She plans to increase activity.   Menopausal vaginal dryness - Plan: estradiol (ESTRACE) 0.1 MG/GM vaginal cream twice weekly. She is aware of risk of systemic absorption.  Screening for cervical cancer - Normal Pap history.  Will repeat at 5-year interval per guidelines.  Screening for breast cancer - Normal mammogram history.  She is overdue for mammogram. Discussed current guidelines and importance of preventative screenings. Information provided on [facility] and she plans to schedule this soon. Normal breast exam today.  Screening for colon cancer - 2019 colonoscopy. Will repeat at GI's recommended interval.   Follow up in 1 year for annual.    2020 [facility], 12:07 PM 05/11/2020

## 2020-05-11 NOTE — Patient Instructions (Addendum)
[facility] [phone] [address]  [place], [place] [address]  Health Maintenance for Postmenopausal Women Menopause is a normal process in which your ability to get pregnant comes to an end. This process happens slowly over many months or years, usually between the ages of 48 and 55. Menopause is complete when you have missed your menstrual periods for 12 months. It is important to talk with your health care provider about some of the most common conditions that affect women after menopause (postmenopausal women). These include heart disease, cancer, and bone loss (osteoporosis). Adopting a healthy lifestyle and getting preventive care can help to promote your health and wellness. The actions you take can also lower your chances of developing some of these common conditions. What should I know about menopause? During menopause, you may get a number of symptoms, such as:  Hot flashes. These can be moderate or severe.  Night sweats.  Decrease in sex drive.  Mood swings.  Headaches.  Tiredness.  Irritability.  Memory problems.  Insomnia. Choosing to treat or not to treat these symptoms is a decision that you make with your health care provider. Do I need hormone replacement therapy?  Hormone replacement therapy is effective in treating symptoms that are caused by menopause, such as hot flashes and night sweats.  Hormone replacement carries certain risks, especially as you become older. If you are thinking about using estrogen or estrogen with progestin, discuss the benefits and risks with your health care provider. What is my risk for heart disease and stroke? The risk of heart disease, heart attack, and stroke increases as you age. One of the causes may be a change in the body's hormones during menopause. This can affect how your body uses dietary fats, triglycerides, and cholesterol. Heart attack and stroke are medical emergencies. There are many  things that you can do to help prevent heart disease and stroke. Watch your blood pressure  High blood pressure causes heart disease and increases the risk of stroke. This is more likely to develop in people who have high blood pressure readings, are of African descent, or are overweight.  Have your blood pressure checked: ? Every 3-5 years if you are 18-39 years of age. ? Every year if you are 40 years old or older. Eat a healthy diet  Eat a diet that includes plenty of vegetables, fruits, low-fat dairy products, and lean protein.  Do not eat a lot of foods that are high in solid fats, added sugars, or sodium.   Get regular exercise Get regular exercise. This is one of the most important things you can do for your health. Most adults should:  Try to exercise for at least 150 minutes each week. The exercise should increase your heart rate and make you sweat (moderate-intensity exercise).  Try to do strengthening exercises at least twice each week. Do these in addition to the moderate-intensity exercise.  Spend less time sitting. Even light physical activity can be beneficial. Other tips  Work with your health care provider to achieve or maintain a healthy weight.  Do not use any products that contain nicotine or tobacco, such as cigarettes, e-cigarettes, and chewing tobacco. If you need help quitting, ask your health care provider.  Know your numbers. Ask your health care provider to check your cholesterol and your blood sugar (glucose). Continue to have your blood tested as directed by your health care provider. Do I need screening for cancer? Depending on your health   history and family history, you may need to have cancer screening at different stages of your life. This may include screening for:  Breast cancer.  Cervical cancer.  Lung cancer.  Colorectal cancer. What is my risk for osteoporosis? After menopause, you may be at increased risk for osteoporosis. Osteoporosis is  a condition in which bone destruction happens more quickly than new bone creation. To help prevent osteoporosis or the bone fractures that can happen because of osteoporosis, you may take the following actions:  If you are 19-50 years old, get at least 1,000 mg of calcium and at least 600 mg of vitamin D per day.  If you are older than age 50 but younger than age 70, get at least 1,200 mg of calcium and at least 600 mg of vitamin D per day.  If you are older than age 70, get at least 1,200 mg of calcium and at least 800 mg of vitamin D per day. Smoking and drinking excessive alcohol increase the risk of osteoporosis. Eat foods that are rich in calcium and vitamin D, and do weight-bearing exercises several times each week as directed by your health care provider. How does menopause affect my mental health? Depression may occur at any age, but it is more common as you become older. Common symptoms of depression include:  Low or sad mood.  Changes in sleep patterns.  Changes in appetite or eating patterns.  Feeling an overall lack of motivation or enjoyment of activities that you previously enjoyed.  Frequent crying spells. Talk with your health care provider if you think that you are experiencing depression. General instructions See your health care provider for regular wellness exams and vaccines. This may include:  Scheduling regular health, dental, and eye exams.  Getting and maintaining your vaccines. These include: ? Influenza vaccine. Get this vaccine each year before the flu season begins. ? Pneumonia vaccine. ? Shingles vaccine. ? Tetanus, diphtheria, and pertussis (Tdap) booster vaccine. Your health care provider may also recommend other immunizations. Tell your health care provider if you have ever been abused or do not feel safe at home. Summary  Menopause is a normal process in which your ability to get pregnant comes to an end.  This condition causes hot flashes, night  sweats, decreased interest in sex, mood swings, headaches, or lack of sleep.  Treatment for this condition may include hormone replacement therapy.  Take actions to keep yourself healthy, including exercising regularly, eating a healthy diet, watching your weight, and checking your blood pressure and blood sugar levels.  Get screened for cancer and depression. Make sure that you are up to date with all your vaccines. This information is not intended to replace advice given to you by your health care provider. Make sure you discuss any questions you have with your health care provider. Document Revised: 02/13/2018 Document Reviewed: 02/13/2018 Elsevier Patient Education  2021 Elsevier Inc.  

## 2020-06-07 ENCOUNTER — Other Ambulatory Visit: Payer: Self-pay | Admitting: Family Medicine

## 2020-06-07 DIAGNOSIS — G4700 Insomnia, unspecified: Secondary | ICD-10-CM

## 2020-06-07 DIAGNOSIS — F419 Anxiety disorder, unspecified: Secondary | ICD-10-CM

## 2020-07-30 ENCOUNTER — Other Ambulatory Visit: Payer: Self-pay | Admitting: Nurse Practitioner

## 2020-07-30 DIAGNOSIS — Z79890 Hormone replacement therapy (postmenopausal): Secondary | ICD-10-CM

## 2021-05-12 ENCOUNTER — Other Ambulatory Visit (HOSPITAL_COMMUNITY)
Admission: RE | Admit: 2021-05-12 | Discharge: 2021-05-12 | Disposition: A | Discharge status: Home / Self Care | Payer: BC Managed Care – PPO | Source: Ambulatory Visit | Attending: Nurse Practitioner | Admitting: Nurse Practitioner

## 2021-05-12 ENCOUNTER — Other Ambulatory Visit: Payer: Self-pay

## 2021-05-12 ENCOUNTER — Encounter: Payer: Self-pay | Admitting: Nurse Practitioner

## 2021-05-12 ENCOUNTER — Ambulatory Visit (INDEPENDENT_AMBULATORY_CARE_PROVIDER_SITE_OTHER): Payer: BC Managed Care – PPO | Admitting: Nurse Practitioner

## 2021-05-12 VITALS — BP 120/78 | Ht 67.0 in | Wt 168.0 lb

## 2021-05-12 DIAGNOSIS — Z79890 Hormone replacement therapy (postmenopausal): Secondary | ICD-10-CM | POA: Diagnosis not present

## 2021-05-12 DIAGNOSIS — Z01419 Encounter for gynecological examination (general) (routine) without abnormal findings: Secondary | ICD-10-CM | POA: Diagnosis present

## 2021-05-12 DIAGNOSIS — R232 Flushing: Secondary | ICD-10-CM | POA: Diagnosis not present

## 2021-05-12 DIAGNOSIS — N951 Menopausal and female climacteric states: Secondary | ICD-10-CM | POA: Diagnosis not present

## 2021-05-12 DIAGNOSIS — R002 Palpitations: Secondary | ICD-10-CM

## 2021-05-12 MED ORDER — PROGESTERONE MICRONIZED 100 MG PO CAPS: 100.0000 mg | ORAL_CAPSULE | Freq: Every day | ORAL | 3 refills | Status: DC

## 2021-05-12 MED ORDER — ESTRADIOL 0.1 MG/GM VA CREA: 1.0000 | TOPICAL_CREAM | VAGINAL | 2 refills | Status: DC

## 2021-05-12 NOTE — Progress Notes (Signed)
? ?Mary Jimenez 1965/03/08 [id number] ? ? ?History:  56 y.o. [id number] presents for annual exam. Postmenopausal - on HRT. She has had an increase in night sweats that occur multiple times per night. She started back exercising and eating healthy, she is down 10 pounds. She has been experiencing episodes of what she thinks is heart palpitations. Many times it occurs at night and wakes her up but she did have it occur for 3 days recently. She has increased stress with her husband's job and possibly moving. She has had stress-induced palpations before and was evaluated by cardiology. Denies chest pain, shortness of breath, or lightheadedness during episodes. She plans to follow up with PCP. Normal pap and mammogram history. Using vaginal estrogen twice weekly for dryness with good management.  ? ?Gynecologic History ?Patient's last menstrual period was 09/11/2019. ?  ?Contraception/Family planning: post menopausal status ?Sexually active: Yes ? ?Health Maintenance ?Last Pap: 2019. Results were: Normal ?Last mammogram: 2019. Results were: Normal ?Last colonoscopy: 2019. Results were: Normal ?Last Dexa: N/A ? ?Past medical history, past surgical history, family history and social history were all reviewed and documented in the EPIC chart. Married. Husband is professor. 82 yo son. Daughter lives local, married with  yo son.  ? ?ROS:  A ROS was performed and pertinent positives and negatives are included. ? ?Exam: ? ?Vitals:  ? 05/12/21 1213  ?BP: 120/78  ?Weight: 168 lb (76.2 kg)  ?Height: 5\' 7"  (1.702 m)  ? ? ?Body mass index is 26.31 kg/m?. ? ?General appearance:  Normal ?Thyroid:  Symmetrical, normal in size, without palpable masses or nodularity. ?Respiratory ? Auscultation:  Clear without wheezing or rhonchi ?Cardiovascular ? Auscultation:  Regular rate, without rubs, murmurs or gallops ? Edema/varicosities:  Not grossly evident ?Abdominal ? Soft,nontender, without masses, guarding or rebound. ? Liver/spleen:  No  organomegaly noted ? Hernia:  None appreciated ? Skin ? Inspection:  Grossly normal ?  ?Breasts: Examined lying and sitting.  ? Right: Without masses, retractions, discharge or axillary adenopathy. ? ? Left: Without masses, retractions, discharge or axillary adenopathy. ?Genitourinary  ? Inguinal/mons:  Normal without inguinal adenopathy ? External genitalia:  Normal appearing vulva with no masses, tenderness, or lesions ? BUS/Urethra/Skene's glands:  Normal ? Vagina:  Normal appearing with normal color and discharge, no lesions ? Cervix:  Normal appearing without discharge or lesions ? Uterus:  Normal in size, shape and contour.  Midline and mobile, nontender ? Adnexa/parametria:   ?  Rt: Normal in size, without masses or tenderness. ?  Lt: Normal in size, without masses or tenderness. ? Anus and perineum: Normal ? Digital rectal exam: Normal sphincter tone without palpated masses or tenderness ? ?Patient informed chaperone available to be present for breast and pelvic exam. Patient has requested no chaperone to be present. Patient has been advised what will be completed during breast and pelvic exam.  ? ?Assessment/Plan:  56 y.o. [id number] for annual exam.  ? ?Well female exam with routine gynecological exam - Plan: Cytology - PAP( [place]), CBC with Differential/Platelet, Comprehensive metabolic panel. Education provided on SBEs, importance of preventative screenings, current guidelines, high calcium diet, regular exercise, and multivitamin daily.  ? ?Postmenopausal hormone therapy - Plan: progesterone (PROMETRIUM) 100 MG capsule daily. Vivelle 0.025 mg patch twice weekly. She has had an increase in night sweats despite changing diet and increasing exercise. They occur multiple times per night. If normal TSH we discussed increasing Estradiol dose to 0.037 mg. She is aware of risk for blood  clots, heart attack, stroke, and breast cancer with use.  ? ?Menopausal vaginal dryness - Plan: estradiol (ESTRACE) 0.1  MG/GM vaginal cream twice weekly with good management.  ? ?Hot flashes - Plan: TSH ? ?Palpitations - Plan: TSH, CBC with Differential/Platelet, Comprehensive metabolic panel. See HPI. Plans to follow up with PCP. ? ?Screening for cervical cancer - Normal Pap history. Pap with HR HPV today.  ? ?Screening for breast cancer - Normal mammogram history.  She is overdue for mammogram. Discussed current guidelines and importance of preventative screenings, especially while take HRT.  Normal breast exam today. ? ?Screening for colon cancer - 2019 colonoscopy. Will repeat at GI's recommended interval.  ? ?Follow up in 1 year for annual.  ? ? ? ? ?Tamela Gammon [facility], 12:50 PM 05/12/2021 ? ?

## 2021-05-13 LAB — CBC WITH DIFFERENTIAL/PLATELET
Absolute Monocytes: 310 cells/uL (ref 200–950)
Basophils Absolute: 60 cells/uL (ref 0–200)
Basophils Relative: 1.4 %
Eosinophils Absolute: 112 cells/uL (ref 15–500)
Eosinophils Relative: 2.6 %
HCT: 38.9 % (ref 35.0–45.0)
Hemoglobin: 12.9 g/dL (ref 11.7–15.5)
Lymphs Abs: 1066 cells/uL (ref 850–3900)
MCH: 30.1 pg (ref 27.0–33.0)
MCHC: 33.2 g/dL (ref 32.0–36.0)
MCV: 90.7 fL (ref 80.0–100.0)
MPV: 12.2 fL (ref 7.5–12.5)
Monocytes Relative: 7.2 %
Neutro Abs: 2752 cells/uL (ref 1500–7800)
Neutrophils Relative %: 64 %
Platelets: 215 10*3/uL (ref 140–400)
RBC: 4.29 10*6/uL (ref 3.80–5.10)
RDW: 11.8 % (ref 11.0–15.0)
Total Lymphocyte: 24.8 %
WBC: 4.3 10*3/uL (ref 3.8–10.8)

## 2021-05-13 LAB — COMPREHENSIVE METABOLIC PANEL
AG Ratio: 2.3 (calc) (ref 1.0–2.5)
ALT: 14 U/L (ref 6–29)
AST: 16 U/L (ref 10–35)
Albumin: 4.5 g/dL (ref 3.6–5.1)
Alkaline phosphatase (APISO): 41 U/L (ref 37–153)
BUN: 20 mg/dL (ref 7–25)
CO2: 25 mmol/L (ref 20–32)
Calcium: 9.8 mg/dL (ref 8.6–10.4)
Chloride: 104 mmol/L (ref 98–110)
Creat: 0.82 mg/dL (ref 0.50–1.03)
Globulin: 2 g/dL (calc) (ref 1.9–3.7)
Glucose, Bld: 84 mg/dL (ref 65–99)
Potassium: 4 mmol/L (ref 3.5–5.3)
Sodium: 139 mmol/L (ref 135–146)
Total Bilirubin: 0.5 mg/dL (ref 0.2–1.2)
Total Protein: 6.5 g/dL (ref 6.1–8.1)

## 2021-05-13 LAB — TSH: TSH: 2.16 mIU/L

## 2021-05-13 LAB — CYTOLOGY - PAP
Comment: NEGATIVE
Diagnosis: NEGATIVE
High risk HPV: NEGATIVE

## 2021-05-16 ENCOUNTER — Other Ambulatory Visit: Payer: Self-pay | Admitting: Nurse Practitioner

## 2021-05-16 DIAGNOSIS — N951 Menopausal and female climacteric states: Secondary | ICD-10-CM

## 2021-05-16 MED ORDER — ESTRADIOL 0.0375 MG/24HR TD PTTW: 1.0000 | MEDICATED_PATCH | TRANSDERMAL | 3 refills | Status: DC

## 2021-05-18 ENCOUNTER — Other Ambulatory Visit: Payer: Self-pay

## 2021-05-18 DIAGNOSIS — N951 Menopausal and female climacteric states: Secondary | ICD-10-CM

## 2021-05-18 DIAGNOSIS — Z79890 Hormone replacement therapy (postmenopausal): Secondary | ICD-10-CM

## 2021-07-06 NOTE — Progress Notes (Signed)
? ? ?ACUTE VISIT ?Chief Complaint  ?Patient presents with  ? Back Pain  ?  Lower back, mainly on the right side. X a week, can't bend over, coughing hurts as well. It is slowly getting better, started out being her entire lower back.   ? ?HPI: ?[name] is a 56 y.o. female, who is here today complaining of back pain as described above. ?No hx of back pain but remembers 3 months ago mild lower back pain when trying to tie her shoes, resolved next day. ?Right lower back, gradual onset, constant achy pain and intermittent sharp pain. ?She did fly to [place] while having back pain and did a lot of walking while there, all these aggravate problem. ? ?Back Pain ?This is a new problem. The current episode started in the past 7 days. The problem has been gradually improving since onset. The pain is present in the lumbar spine. The quality of the pain is described as aching. The pain does not radiate. The pain is at a severity of 7/10. The pain is moderate. The pain is Worse during the day. The symptoms are aggravated by bending, sitting and position. Stiffness is present All day. Pertinent negatives include no abdominal pain, bladder incontinence, bowel incontinence, chest pain, dysuria, fever, leg pain, numbness, paresis, paresthesias, pelvic pain, perianal numbness, tingling, weakness or weight loss. Risk factors include menopause. She has tried NSAIDs for the symptoms. The treatment provided mild relief.  ?Alleviated by lying down. ?In the morning she feels better. ?She exercises at the gym regularly and does not remember doing something different. ? ?She has not taken any medication for pain today. ? ?PHQ positive. ?Long hx of depression and anxiety. ?Sees therapist weekly, has done so for the past year. ?She thinks depression is because she may have ADHD. She was referred to attention def clinic by psychotherapist. ?Having marital problems for the past year. ?Took Prozac years ago. ?Exercise has  helped. ? ? ?  07/08/2021  ? 12:34 PM  ?Depression screen PHQ 2/9  ?Decreased Interest 2  ?Down, Depressed, Hopeless 3  ?PHQ - 2 Score 5  ?Altered sleeping 2  ?Tired, decreased energy 3  ?Change in appetite 3  ?Feeling bad or failure about yourself  2  ?Trouble concentrating 3  ?Moving slowly or fidgety/restless 3  ?Suicidal thoughts 0  ?PHQ-9 Score 21  ? ?Review of Systems  ?Constitutional:  Positive for activity change. Negative for chills, fever and weight loss.  ?Respiratory:  Negative for cough, shortness of breath and wheezing.   ?Cardiovascular:  Negative for chest pain and leg swelling.  ?Gastrointestinal:  Negative for abdominal pain, bowel incontinence, nausea and vomiting.  ?Genitourinary:  Negative for bladder incontinence, dysuria and pelvic pain.  ?Musculoskeletal:  Positive for back pain.  ?Skin:  Negative for rash.  ?Neurological:  Negative for tingling, weakness, numbness and paresthesias.  ?Hematological:  Negative for adenopathy. Does not bruise/bleed easily.  ?Rest see pertinent positives and negatives per HPI. ? ?Current Outpatient Medications on File Prior to Visit  ?Medication Sig Dispense Refill  ? cetirizine (ZYRTEC) 10 MG tablet Take 10 mg by mouth daily.    ? estradiol (ESTRACE) 0.1 MG/GM vaginal cream Place 1 Applicatorful vaginally 2 (two) times a week. Insert 2 grams twice weekly 42.5 g 2  ? estradiol (VIVELLE-DOT) 0.0375 MG/24HR Place 1 patch onto the skin 2 (two) times a week. 24 patch 3  ? progesterone (PROMETRIUM) 100 MG capsule Take 1 capsule (100 mg total) by mouth  daily. 90 capsule 3  ? ?No current facility-administered medications on file prior to visit.  ? ? ? ?Past Medical History:  ?Diagnosis Date  ? Anxiety   ? Asthma   ? Depression   ? History of chicken pox   ? History of colon polyps   ? ?Allergies  ?Allergen Reactions  ? Pseudoephedrine Other (See Comments)  ?  Actifed caused hallucinations as a child  ? Latex Itching  ? ?Social History  ? ?Socioeconomic History  ? Marital  status: Married  ?  Spouse name: Not on file  ? Number of children: 2  ? Years of education: Not on file  ? Highest education level: Not on file  ?Occupational History  ? Not on file  ?Tobacco Use  ? Smoking status: Former  ?  Packs/day: 1.00  ?  Years: 7.00  ?  Pack years: 7.00  ?  Types: Cigarettes  ? Smokeless tobacco: Never  ? Tobacco comments:  ?  smoked off and on  ?Vaping Use  ? Vaping Use: Never used  ?Substance and Sexual Activity  ? Alcohol use: Never  ? Drug use: Never  ? Sexual activity: Yes  ?  Birth control/protection: Post-menopausal  ?  Comment: 1st intercourse 56 yo-Fewer than 5 partners  ?Other Topics Concern  ? Not on file  ?Social History Narrative  ? Not on file  ? ?Social Determinants of Health  ? ?Financial Resource Strain: Not on file  ?Food Insecurity: Not on file  ?Transportation Needs: Not on file  ?Physical Activity: Not on file  ?Stress: Not on file  ?Social Connections: Not on file  ? ?Vitals:  ? 07/08/21 1219  ?BP: 128/80  ?Pulse: 75  ?Resp: 16  ?SpO2: 99%  ? ?Body mass index is 25.59 kg/m?. ? ?Physical Exam ?Vitals and nursing note reviewed.  ?Constitutional:   ?   General: She is not in acute distress. ?   Appearance: She is well-developed. She is not ill-appearing.  ?HENT:  ?   Head: Normocephalic and atraumatic.  ?Eyes:  ?   Conjunctiva/sclera: Conjunctivae normal.  ?Cardiovascular:  ?   Rate and Rhythm: Normal rate and regular rhythm.  ?Pulmonary:  ?   Effort: Pulmonary effort is normal. No respiratory distress.  ?   Breath sounds: Normal breath sounds.  ?Abdominal:  ?   Palpations: Abdomen is soft. There is no hepatomegaly or mass.  ?   Tenderness: There is no abdominal tenderness.  ?Musculoskeletal:  ?   Lumbar back: Tenderness present. No bony tenderness. Negative right straight leg raise test and negative left straight leg raise test.  ?     Back: ? ?   Comments: No significant deformity appreciated. ?Pain elicited with movement on exam table during examination. ?No local  edema or erythema appreciated, no suspicious lesions.  ?Skin: ?   General: Skin is warm.  ?   Findings: No erythema or rash.  ?Neurological:  ?   General: No focal deficit present.  ?   Mental Status: She is alert and oriented to person, place, and time.  ?   Coordination: Coordination normal.  ?   Deep Tendon Reflexes:  ?   Reflex Scores: ?     Patellar reflexes are 2+ on the right side and 2+ on the left side. ?   Comments: Able to walk on tip toes and heels with no limitations. ?Antalgic gait.  ?Psychiatric:     ?   Mood and Affect: Mood is anxious. Affect  is labile.  ?   Comments: Well groomed, good eye contact.  ? ?ASSESSMENT AND PLAN: ? ?[name] was seen today for back pain. ? ?Diagnoses and all orders for this visit: ?Orders Placed This Encounter  ?Procedures  ? DG Lumbar Spine Complete  ? Ambulatory referral to Physical Therapy  ? ?Acute bilateral low back pain without sciatica ?Hx and examination do not suggest a serious process. ?Lumbar X ray ordered today. ?After verbal consent she received Toradol 60 mg IM x 1. Observation for 20 min. ?In 8 hours she can start Celebrex 100 mg bid for 7-10 days. ?Methocarbamol 500 mg tid prn. ?Side effects of medications discussed. ?PT will be arranged. ?Instructed about warning signs. ? ?-     celecoxib (CELEBREX) 100 MG capsule; Take 1 capsule (100 mg total) by mouth 2 (two) times daily for 10 days. ?-     methocarbamol (ROBAXIN) 500 MG tablet; Take 1 tablet (500 mg total) by mouth every 8 (eight) hours as needed for up to 15 days for muscle spasms. ?-     ketorolac (TORADOL) injection 60 mg ? ?Severe episode of recurrent major depressive disorder, without psychotic features (HCC) ?She has tried Prozac in the past and felt "flat", so she weaned it off. ?We discussed other pharmacologic options, SSRI and SNRI's. She is not sure if she wants medication at this time. She was referred to attention deficit clinic by her therapist and she feels like this will help. ?She will  continue weekly CBT and will let me know if interested in trying one of the options discussed today. ? ?Return if symptoms worsen or fail to improve. ? ?Dael Howland G. [place], MD ? ?[facility] ?[facility]

## 2021-07-08 ENCOUNTER — Ambulatory Visit (INDEPENDENT_AMBULATORY_CARE_PROVIDER_SITE_OTHER): Payer: BC Managed Care – PPO

## 2021-07-08 ENCOUNTER — Ambulatory Visit: Payer: BC Managed Care – PPO | Admitting: Family Medicine

## 2021-07-08 ENCOUNTER — Encounter: Payer: Self-pay | Admitting: Family Medicine

## 2021-07-08 VITALS — BP 128/80 | HR 75 | Resp 16 | Ht 67.0 in | Wt 163.4 lb

## 2021-07-08 DIAGNOSIS — M545 Low back pain, unspecified: Secondary | ICD-10-CM

## 2021-07-08 DIAGNOSIS — F332 Major depressive disorder, recurrent severe without psychotic features: Secondary | ICD-10-CM | POA: Diagnosis not present

## 2021-07-08 MED ORDER — KETOROLAC TROMETHAMINE 60 MG/2ML IM SOLN
60.0000 mg | Freq: Once | INTRAMUSCULAR | Status: AC
  Administered 2021-07-08: 60 mg via INTRAMUSCULAR

## 2021-07-08 MED ORDER — CELECOXIB 100 MG PO CAPS: 100.0000 mg | ORAL_CAPSULE | Freq: Two times a day (BID) | ORAL | 0 refills | Status: AC

## 2021-07-08 MED ORDER — METHOCARBAMOL 500 MG PO TABS: 500.0000 mg | ORAL_TABLET | Freq: Three times a day (TID) | ORAL | 0 refills | Status: AC | PRN

## 2021-07-08 NOTE — Patient Instructions (Addendum)
A few things to remember from today's visit: ? ?Acute bilateral low back pain without sciatica - Plan: DG Lumbar Spine Complete, ketorolac (TORADOL) injection 60 mg ? ?Severe episode of recurrent major depressive disorder, without psychotic features (HCC) ? ?Here today you received Toradol 60 mg , in 8 hours you can start Celebrex. ?You can add over the counter Tylenol. ?Local heat and ice. ?Do not use My Chart to request refills or for acute issues that need immediate attention. ? ?If a new problem present, please set up appointment sooner than planned today. ? ?Back pain is very common in adults. The cause of back pain is rarely dangerous and the pain often gets better over time even with no pharmacologic treatment. ? ?The cause of your back pain may not be known. Some common causes of back pain include: ?Strain of the muscles or ligaments supporting the spine. ?Wear and tear (degeneration) of the spinal disks. ?Arthritis. ?Direct injury to the back. ? ?For many people, back pain may return. Since back pain is rarely dangerous, most people can learn to manage this condition on their own. ? ?HOME CARE INSTRUCTIONS ?Watch your back pain for any changes. The following actions may help to lessen any discomfort you are feeling: ? ?Remain active. It is stressful on your back to sit or stand in one place for long periods of time. Do not sit, drive, or stand in one place for more than 30 minutes at a time. Take short walks on even surfaces as soon as you are able. Try to increase the length of time you walk each day. ? ?2. Exercise regularly as directed by your health care provider. Exercise helps your back heal faster. It also helps avoid future injury by keeping your muscles strong and flexible. ? ?3. Do not stay in bed. Resting more than 1-2 days can delay your recovery. ? ? ?                                                   ?4. Pay attention to your body when you bend and lift. The most comfortable positions are  those that put less stress on your recovering back. ? ?5.  Always use proper lifting techniques, including: ?Bending your knees. ?Keeping the load close to your body. ?Avoiding twisting. ? ?6. Find a comfortable position to sleep. Use a firm mattress and lie on your side with your knees slightly bent. If you lie on your back, put a pillow under your knees. ? ?7. Over the counter rubbing medications like Icy Hot or Asper cream with Lidocaine may help without significant side effects.  ?Acetaminophen and/or Aleve/Ibuprofen can be taken if needed and if not contraindications. ?Local ice and heat may be alternated to reduce pain and spasms. Also massage and even chiropractor treatment. ? ?    Muscle relaxants might or might not help, they cause drowsiness among other    side effects. They could also interact with some of medications you may be already taking (medications for depression/anxiety and some pain medications). ? ? ?8. Maintain a healthy weight. Excess weight puts extra stress on your back and makes it difficult to maintain good posture. ? ? ?SEEK MEDICAL CARE IF: worsening pain, associated fever, rash/edema on area, pain going to legs or buttocks, numbness/tingling, night pain, or abnormal weight loss.  ? ? ?  SEEK IMMEDIATE MEDICAL CARE IF:  ?You develop new bowel or bladder control problems. ?You have unusual weakness or numbness in your arms or legs. ?You develop nausea or vomiting. ?You develop abdominal pain. ?You feel faint. ?  ? ? Back Exercises ?The following exercises strengthen the muscles that help to support the back. They also help to keep the lower back flexible. Doing these exercises can help to prevent back pain or lessen existing pain. ?If you have back pain or discomfort, try doing these exercises 2-3 times each day or as told by your health care provider. When the pain goes away, do them once each day, but increase the number of times that you repeat the steps for each exercise (do more  repetitions). If you do not have back pain or discomfort, do these exercises once each day or as told by your health care provider. ? ? ?EXERCISES ?Single Knee to Chest ?Repeat these steps 3-5 times for each leg: ?Lie on your back on a firm bed or the floor with your legs extended. ?Bring one knee to your chest. Your other leg should stay extended and in contact with the floor. ?Hold your knee in place by grabbing your knee or thigh. ?Pull on your knee until you feel a gentle stretch in your lower back. ?Hold the stretch for 10-30 seconds. ?Slowly release and straighten your leg. ? ?Pelvic Tilt ?Repeat these steps 5-10 times: ?Lie on your back on a firm bed or the floor with your legs extended. ?Bend your knees so they are pointing toward the ceiling and your feet are flat on the floor. ?Tighten your lower abdominal muscles to press your lower back against the floor. This motion will tilt your pelvis so your tailbone points up toward the ceiling instead of pointing to your feet or the floor. ?With gentle tension and even breathing, hold this position for 5-10 seconds. ? ?Cat-Cow ?Repeat these steps until your lower back becomes more flexible: ?Get into a hands-and-knees position on a firm surface. Keep your hands under your shoulders, and keep your knees under your hips. You may place padding under your knees for comfort. ?Let your head hang down, and point your tailbone toward the floor so your lower back becomes rounded like the back of a cat. ?Hold this position for 5 seconds. ?Slowly lift your head and point your tailbone up toward the ceiling so your back forms a sagging arch like the back of a cow. ?Hold this position for 5 seconds. ? ? ?Press-Ups ?Repeat these steps 5-10 times: ?Lie on your abdomen (face-down) on the floor. ?Place your palms near your head, about shoulder-width apart. ?While you keep your back as relaxed as possible and keep your hips on the floor, slowly straighten your arms to raise the top  half of your body and lift your shoulders. Do not use your back muscles to raise your upper torso. You may adjust the placement of your hands to make yourself more comfortable. ?Hold this position for 5 seconds while you keep your back relaxed. ?Slowly return to lying flat on the floor. ? ? ?Bridges ?Repeat these steps 10 times: ?Lie on your back on a firm surface. ?Bend your knees so they are pointing toward the ceiling and your feet are flat on the floor. ?Tighten your buttocks muscles and lift your buttocks off of the floor until your waist is at almost the same height as your knees. You should feel the muscles working in your buttocks and the  back of your thighs. If you do not feel these muscles, slide your feet 1-2 inches farther away from your buttocks. ?Hold this position for 3-5 seconds. ?Slowly lower your hips to the starting position, and allow your buttocks muscles to relax completely. ?If this exercise is too easy, try doing it with your arms crossed over your chest. ? ? ? ? ? ? ?

## 2021-07-10 ENCOUNTER — Encounter: Payer: Self-pay | Admitting: Family Medicine

## 2021-07-13 ENCOUNTER — Ambulatory Visit: Payer: BC Managed Care – PPO

## 2021-08-12 NOTE — Progress Notes (Signed)
ACUTE VISIT Chief Complaint  Patient presents with   Anxiety    Getting worse, was talked about during last visit in May, would like to discuss medications.   Depression    Seeing therapist, currently separating from husband.   Fatigue    Vegan, would like her vitamin levels checked to see if that is contributing to her fatigue or if it's everything else going on.   HPI: [name] is a 56 y.o. female, who is here today complaining of worsening anxiety and depression. She was seen on 07/08/21, when we discussed pharmacologic options but she was not sure about med at that time. Since her last visit she has separated from her husband, she is living with her daughter. She is seeing psychotherapist 2 times per week. According to pt, Wellbutrin was suggested.  Years hx of anxiety and depression. She has taken Prozac among some, it did helped but she felt "flat." She has not seen psychiatrist. FHx negative for bipolar disorder.     08/15/2021    3:09 PM 07/08/2021   12:34 PM  Depression screen PHQ 2/9  Decreased Interest 2 2  Down, Depressed, Hopeless 2 3  PHQ - 2 Score 4 5  Altered sleeping 3 2  Tired, decreased energy 3 3  Change in appetite 3 3  Feeling bad or failure about yourself  2 2  Trouble concentrating 3 3  Moving slowly or fidgety/restless 3 3  Suicidal thoughts 0 0  PHQ-9 Score 21 21  Difficult doing work/chores Very difficult    States that her therapist referred [facility] screened for ADHD, she thinks she has this condition.  She is concerned about fatigue, worse for the past 6 months. She would like "vitamins check", she is taking multivitamins. Sleeps about 8-9 hours, interrupted, wakes up about 3 times per night, about 2 times per week she has a hard time going back to sleep. No known hx of OSA.  Hx of B12 deficiency. Negative for numbness or tingling.  -A few months of pruritic upper chest and neck rash. Negative for new  medication, detergent, soap, or body product. No known insect bite or outdoor exposures to plants. No sick contact. No Hx of eczema or similar rash in the past.  OTC medication for this problem: None.  No associated oral lesions/edema,cough, wheezing, dyspnea, abdominal pain, nausea, or vomiting.  Review of Systems  Constitutional:  Negative for chills and fever.  HENT:  Negative for mouth sores, nosebleeds and sore throat.   Eyes:  Negative for redness and visual disturbance.  Cardiovascular:  Negative for chest pain, palpitations and leg swelling.  Gastrointestinal:        Negative for changes in bowel habits.  Genitourinary:  Negative for decreased urine volume and hematuria.  Musculoskeletal:  Negative for joint swelling and myalgias.  Neurological:  Negative for syncope and weakness.  Psychiatric/Behavioral:  Positive for sleep disturbance. Negative for confusion. The patient is nervous/anxious.   Rest see pertinent positives and negatives per HPI.  Current Outpatient Medications on File Prior to Visit  Medication Sig Dispense Refill   cetirizine (ZYRTEC) 10 MG tablet Take 10 mg by mouth daily.     estradiol (ESTRACE) 0.1 MG/GM vaginal cream Place 1 Applicatorful vaginally 2 (two) times a week. Insert 2 grams twice weekly 42.5 g 2   estradiol (VIVELLE-DOT) 0.0375 MG/24HR Place 1 patch onto the skin 2 (two) times a week. 24 patch 3   No current  facility-administered medications on file prior to visit.   Past Medical History:  Diagnosis Date   Anxiety    Asthma    Depression    History of chicken pox    History of colon polyps    Allergies  Allergen Reactions   Pseudoephedrine Other (See Comments)    Actifed caused hallucinations as a child   Latex Itching   Social History   Socioeconomic History   Marital status: Married    Spouse name: Not on file   Number of children: 2   Years of education: Not on file   Highest education level: Some college, no degree   Occupational History   Not on file  Tobacco Use   Smoking status: Former    Packs/day: 1.00    Years: 7.00    Total pack years: 7.00    Types: Cigarettes   Smokeless tobacco: Never   Tobacco comments:    smoked off and on  Vaping Use   Vaping Use: Never used  Substance and Sexual Activity   Alcohol use: Never   Drug use: Never   Sexual activity: Yes    Birth control/protection: Post-menopausal    Comment: 1st intercourse 56 yo-Fewer than 5 partners  Other Topics Concern   Not on file  Social History Narrative   Not on file   Social Determinants of Health   Financial Resource Strain: Medium Risk (08/15/2021)   Overall Financial Resource Strain (CARDIA)    Difficulty of Paying Living Expenses: Somewhat hard  Food Insecurity: Food Insecurity Present (08/15/2021)   Hunger Vital Sign    Worried About Running Out of Food in the Last Year: Sometimes true    Ran Out of Food in the Last Year: Patient refused  Transportation Needs: No Transportation Needs (08/15/2021)   PRAPARE - Hydrologist (Medical): No    Lack of Transportation (Non-Medical): No  Physical Activity: Insufficiently Active (08/15/2021)   Exercise Vital Sign    Days of Exercise per Week: 3 days    Minutes of Exercise per Session: 40 min  Stress: Stress Concern Present (08/15/2021)   [place]    Feeling of Stress : Very much  Social Connections: Socially Isolated (08/15/2021)   Social Connection and Isolation Panel [NHANES]    Frequency of Communication with Friends and Family: More than three times a week    Frequency of Social Gatherings with Friends and Family: More than three times a week    Attends Religious Services: Never    Marine scientist or Organizations: No    Attends Archivist Meetings: Not on file    Marital Status: Separated   Vitals:   08/15/21 1427  BP: 120/70  Pulse: 74  Resp: 16   Temp: 98.4 F (36.9 C)  SpO2: 98%   Body mass index is 25.22 kg/m.  Physical Exam Vitals and nursing note reviewed.  Constitutional:      General: She is not in acute distress.    Appearance: She is well-developed.  HENT:     Head: Normocephalic and atraumatic.     Mouth/Throat:     Mouth: Mucous membranes are moist.     Pharynx: Oropharynx is clear.  Eyes:     Conjunctiva/sclera: Conjunctivae normal.  Cardiovascular:     Rate and Rhythm: Normal rate and regular rhythm.     Heart sounds: No murmur heard. Pulmonary:     Effort: Pulmonary  effort is normal. No respiratory distress.     Breath sounds: Normal breath sounds.  Abdominal:     Palpations: Abdomen is soft. There is no mass.     Tenderness: There is no abdominal tenderness.  Lymphadenopathy:     Cervical: No cervical adenopathy.  Skin:    General: Skin is warm.     Findings: Rash present.     Comments: Micropapular erythematous rash on upper chest and anterior neck. No induration or tenderness.  Neurological:     General: No focal deficit present.     Mental Status: She is alert and oriented to person, place, and time.     Gait: Gait normal.  Psychiatric:        Mood and Affect: Mood is anxious and depressed. Affect is tearful.        Thought Content: Thought content does not include homicidal or suicidal ideation. Thought content does not include homicidal or suicidal plan.     Comments: Well groomed, good eye contact.   ASSESSMENT AND PLAN:  Ms.Yaquelin was seen today for anxiety, depression and fatigue.  Diagnoses and all orders for this visit:  Fatigue, unspecified type We discussed possible etiologies. She had blood work in 05/2021 (CMP,TSH,CBC), otherwise negative. Depression and anxiety as well as insomnia can be contributing factors. B12 can be checked today but I do not think we need to further work up at this time.  Anxiety disorder, unspecified type We discussed treatment options. Wellbutrin  could aggravate problem.If needed we can add low dose SSRI. Continue CBT.  B12 deficiency She is taking multivitamins. Further recommendations according to B12 result.  Severe episode of recurrent major depressive disorder, without psychotic features ([place]) She would like to try Wellbutrin, we discussed some side effects. She has hx of palpitations, so will start low dose Wellbutrin. Continue CBT. Instructed about warning signs.  -     buPROPion (WELLBUTRIN) 75 MG tablet; Take 1 tablet (75 mg total) by mouth 2 (two) times daily.  Pruritic erythematous rash ? Eczema. Recommend topical Triamcinolone bid x 14 days then prn. F/U as needed.  -     triamcinolone cream (KENALOG) 0.1 %; Apply 1 application  topically 2 (two) times daily as needed.  I spent a total of 35 minutes in both face to face and non face to face activities for this visit on the date of this encounter. During this time history was obtained and documented, examination was performed, prior labs reviewed, and assessment/plan discussed.  Return in about 5 weeks (around 09/19/2021).  Kevron Patella G. Martinique, MD  [facility]. [place] office.

## 2021-08-15 ENCOUNTER — Ambulatory Visit (INDEPENDENT_AMBULATORY_CARE_PROVIDER_SITE_OTHER): Payer: BC Managed Care – PPO | Admitting: Family Medicine

## 2021-08-15 VITALS — BP 120/70 | HR 74 | Temp 98.4°F | Resp 16 | Ht 67.0 in | Wt 161.0 lb

## 2021-08-15 DIAGNOSIS — L298 Other pruritus: Secondary | ICD-10-CM

## 2021-08-15 DIAGNOSIS — F419 Anxiety disorder, unspecified: Secondary | ICD-10-CM

## 2021-08-15 DIAGNOSIS — E538 Deficiency of other specified B group vitamins: Secondary | ICD-10-CM | POA: Diagnosis not present

## 2021-08-15 DIAGNOSIS — R5383 Other fatigue: Secondary | ICD-10-CM | POA: Diagnosis not present

## 2021-08-15 DIAGNOSIS — F332 Major depressive disorder, recurrent severe without psychotic features: Secondary | ICD-10-CM | POA: Diagnosis not present

## 2021-08-15 MED ORDER — TRIAMCINOLONE ACETONIDE 0.1 % EX CREA: 1.0000 "application " | TOPICAL_CREAM | Freq: Two times a day (BID) | CUTANEOUS | 1 refills | Status: AC | PRN

## 2021-08-15 MED ORDER — BUPROPION HCL 75 MG PO TABS: 75.0000 mg | ORAL_TABLET | Freq: Two times a day (BID) | ORAL | 1 refills | Status: DC

## 2021-08-15 NOTE — Patient Instructions (Addendum)
A few things to remember from today's visit:   Fatigue, unspecified type  Anxiety disorder, unspecified type  B12 deficiency - Plan: Vitamin B12, Vitamin B12  Severe episode of recurrent major depressive disorder, without psychotic features (HCC) - Plan: buPROPion (WELLBUTRIN) 75 MG tablet  Pruritic erythematous rash - Plan: triamcinolone cream (KENALOG) 0.1 %  If you need refills please call your pharmacy. Do not use My Chart to request refills or for acute issues that need immediate attention.   Wellbutrin once daily for 1 week and then 2 times daily. I will see you back in 4-5 weeks. Apply cream on affected area, small amount 2 times daily for 14 days then as needed.  Please be sure medication list is accurate. If a new problem present, please set up appointment sooner than planned today.

## 2021-08-16 ENCOUNTER — Encounter: Payer: Self-pay | Admitting: Nurse Practitioner

## 2021-08-16 DIAGNOSIS — Z79890 Hormone replacement therapy (postmenopausal): Secondary | ICD-10-CM

## 2021-08-16 LAB — VITAMIN B12: Vitamin B-12: 802 pg/mL (ref 211–911)

## 2021-08-17 MED ORDER — PROGESTERONE MICRONIZED 100 MG PO CAPS: 100.0000 mg | ORAL_CAPSULE | Freq: Every day | ORAL | 2 refills | Status: DC

## 2021-08-17 NOTE — Telephone Encounter (Signed)
I called [facility] where her Rx for Progesterone was sent back on 05/26/21 and I asked the pharmacy to cancel that Rx since she would like it somewhere else.

## 2021-12-01 ENCOUNTER — Other Ambulatory Visit: Payer: Self-pay | Admitting: Family Medicine

## 2021-12-01 DIAGNOSIS — F419 Anxiety disorder, unspecified: Secondary | ICD-10-CM

## 2021-12-01 DIAGNOSIS — G4700 Insomnia, unspecified: Secondary | ICD-10-CM

## 2022-03-16 ENCOUNTER — Other Ambulatory Visit: Payer: Self-pay

## 2022-03-16 DIAGNOSIS — N951 Menopausal and female climacteric states: Secondary | ICD-10-CM

## 2022-03-16 MED ORDER — ESTRADIOL 0.1 MG/GM VA CREA: 1.0000 | TOPICAL_CREAM | VAGINAL | 0 refills | Status: DC

## 2022-03-16 NOTE — Telephone Encounter (Signed)
AEX 05/16/22. Scheduled 05/13/2022

## 2022-05-12 NOTE — Progress Notes (Signed)
HPI: [name] is a 57 y.o. female, who is here today for her routine physical.  Last CPE: ***  Regular exercise 3 or more time per week: *** Following a healthy diet: ***  Chronic medical problems: ***   There is no immunization history on file for this patient. Health Maintenance  Topic Date Due   COVID-19 Vaccine (1) Never done   Hepatitis C Screening  Never done   DTaP/Tdap/Td (1 - Tdap) Never done   Zoster Vaccines- Shingrix (1 of 2) Never done   MAMMOGRAM  10/03/2019   INFLUENZA VACCINE  Never done   HIV Screening  01/21/2024 (Originally 03/05/1981)   PAP SMEAR-Modifier  05/12/2024   COLONOSCOPY (Pts 45-25yr Insurance coverage will need to be confirmed)  11/19/2027   HPV VACCINES  Aged Out    She has *** concerns today.  Review of Systems  Current Outpatient Medications on File Prior to Visit  Medication Sig Dispense Refill   buPROPion (WELLBUTRIN) 75 MG tablet Take 1 tablet (75 mg total) by mouth 2 (two) times daily. 60 tablet 1   cetirizine (ZYRTEC) 10 MG tablet Take 10 mg by mouth daily.     estradiol (ESTRACE) 0.1 MG/GM vaginal cream Place 1 Applicatorful vaginally 2 (two) times a week. Insert 2 grams twice weekly 42.5 g 0   estradiol (VIVELLE-DOT) 0.0375 MG/24HR Place 1 patch onto the skin 2 (two) times a week. 24 patch 3   progesterone (PROMETRIUM) 100 MG capsule Take 1 capsule (100 mg total) by mouth daily. 90 capsule 2   triamcinolone cream (KENALOG) 0.1 % Apply 1 application  topically 2 (two) times daily as needed. 30 g 1   No current facility-administered medications on file prior to visit.    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 type    Social History   Socioeconomic History   Marital status: Married    Spouse name: Not on file   Number of children: 2   Years of education: Not on file   Highest education level: Some college, no degree  Occupational History   Not on file  Tobacco Use   Smoking status: Former    Packs/day: 1.00    Years: 7.00    Total pack years: 7.00    Types: Cigarettes   Smokeless tobacco: Never   Tobacco comments:    smoked off and on  Vaping Use   Vaping Use: Never  used  Substance and Sexual Activity   Alcohol use: Never   Drug use: Never   Sexual activity: Yes    Birth control/protection: Post-menopausal    Comment: 1st intercourse 57 yo-Fewer than 5 partners  Other Topics Concern   Not on file  Social History Narrative   Not on file   Social Determinants of Health   Financial Resource Strain: Medium Risk (08/15/2021)   Overall Financial Resource Strain (CARDIA)    Difficulty of Paying Living Expenses: Somewhat hard  Food Insecurity: Food Insecurity Present (08/15/2021)   Hunger Vital Sign    Worried About Running Out of Food in the Last Year: Sometimes true    Ran Out of Food in the Last Year: Patient refused  Transportation Needs: No Transportation Needs (08/15/2021)   PRAPARE - Hydrologist (Medical): No    Lack of Transportation (Non-Medical): No  Physical Activity: Insufficiently Active (08/15/2021)   Exercise Vital Sign    Days of Exercise per  Week: 3 days    Minutes of Exercise per Session: 40 min  Stress: Stress Concern Present (08/15/2021)   [place]    Feeling of Stress : Very much  Social Connections: Socially Isolated (08/15/2021)   Social Connection and Isolation Panel [NHANES]    Frequency of Communication with Friends and Family: More than three times a week    Frequency of Social Gatherings with Friends and Family: More than three times a week    Attends Religious Services: Never    Marine scientist or Organizations: No    Attends Music therapist: Not on file    Marital Status: Separated    There were no vitals filed for this visit. There is no height or weight on file to calculate BMI.  Wt Readings from Last 3 Encounters:  08/15/21 161 lb (73 kg)  07/08/21 163 lb 6 oz (74.1 kg)  05/12/21 168 lb (76.2 kg)    Physical Exam  ASSESSMENT AND PLAN: [name] was here today annual physical examination.  No orders of the defined types were placed in this encounter.   There are no diagnoses linked to this encounter.  There are no diagnoses linked to this encounter.  No follow-ups on file.  Shaan Rhoads G. [place], MD  [facility]. [place] office.

## 2022-05-15 ENCOUNTER — Ambulatory Visit (INDEPENDENT_AMBULATORY_CARE_PROVIDER_SITE_OTHER): Payer: BLUE CROSS/BLUE SHIELD | Admitting: Family Medicine

## 2022-05-15 ENCOUNTER — Encounter: Payer: Self-pay | Admitting: Family Medicine

## 2022-05-15 VITALS — BP 110/76 | HR 76 | Temp 97.8°F | Resp 16 | Ht 67.0 in | Wt 164.2 lb

## 2022-05-15 DIAGNOSIS — Z13220 Encounter for screening for lipoid disorders: Secondary | ICD-10-CM

## 2022-05-15 DIAGNOSIS — Z23 Encounter for immunization: Secondary | ICD-10-CM

## 2022-05-15 DIAGNOSIS — Z0000 Encounter for general adult medical examination without abnormal findings: Secondary | ICD-10-CM | POA: Insufficient documentation

## 2022-05-15 DIAGNOSIS — Z1329 Encounter for screening for other suspected endocrine disorder: Secondary | ICD-10-CM

## 2022-05-15 DIAGNOSIS — E538 Deficiency of other specified B group vitamins: Secondary | ICD-10-CM | POA: Diagnosis not present

## 2022-05-15 DIAGNOSIS — Z1159 Encounter for screening for other viral diseases: Secondary | ICD-10-CM

## 2022-05-15 DIAGNOSIS — R194 Change in bowel habit: Secondary | ICD-10-CM

## 2022-05-15 DIAGNOSIS — Z13228 Encounter for screening for other metabolic disorders: Secondary | ICD-10-CM

## 2022-05-15 DIAGNOSIS — Z130 Encounter for screening for diseases of the blood and blood-forming organs and certain disorders involving the immune mechanism: Secondary | ICD-10-CM | POA: Diagnosis not present

## 2022-05-15 DIAGNOSIS — F332 Major depressive disorder, recurrent severe without psychotic features: Secondary | ICD-10-CM

## 2022-05-15 NOTE — Assessment & Plan Note (Signed)
We discussed the importance of regular physical activity and healthy diet for prevention of chronic illness and/or complications. Preventive guidelines reviewed. Vaccination: Shingrix 1st dose given today. Ca++ and vit D supplementation recommended. She sees her gyn for her female preventive care, due for mammogram. Next CPE in a year.

## 2022-05-15 NOTE — Assessment & Plan Note (Signed)
Symptoms have improved. She is not on pharmacologic treatment, planning on resuming CBT. Instructed about warning signs.

## 2022-05-15 NOTE — Assessment & Plan Note (Signed)
Last B12 normal at 802. She is not on B12 supplementation. B12 ordered today as requested.

## 2022-05-15 NOTE — Patient Instructions (Addendum)
A few things to remember from today's visit:  Routine general medical examination at a health care facility  Encounter for HCV screening test for low risk patient - Plan: Hepatitis C antibody  Screening for lipoid disorders - Plan: Lipid panel  Screening for endocrine, metabolic and immunity disorder - Plan: Basic metabolic panel, Hemoglobin A1c  B12 deficiency - Plan: Vitamin B12  Change in bowel habits - Plan: Ambulatory referral to Gastroenterology  If you need refills for medications you take chronically, please call your pharmacy. Do not use My Chart to request refills or for acute issues that need immediate attention. If you send a my chart message, it may take a few days to be addressed, specially if I am not in the office.  Please be sure medication list is accurate. If a new problem present, please set up appointment sooner than planned today.  Health Maintenance, Female Adopting a healthy lifestyle and getting preventive care are important in promoting health and wellness. Ask your health care provider about: The right schedule for you to have regular tests and exams. Things you can do on your own to prevent diseases and keep yourself healthy. What should I know about diet, weight, and exercise? Eat a healthy diet  Eat a diet that includes plenty of vegetables, fruits, low-fat dairy products, and lean protein. Do not eat a lot of foods that are high in solid fats, added sugars, or sodium. Maintain a healthy weight Body mass index (BMI) is used to identify weight problems. It estimates body fat based on height and weight. Your health care provider can help determine your BMI and help you achieve or maintain a healthy weight. Get regular exercise Get regular exercise. This is one of the most important things you can do for your health. Most adults should: Exercise for at least 150 minutes each week. The exercise should increase your heart rate and make you sweat  (moderate-intensity exercise). Do strengthening exercises at least twice a week. This is in addition to the moderate-intensity exercise. Spend less time sitting. Even light physical activity can be beneficial. Watch cholesterol and blood lipids Have your blood tested for lipids and cholesterol at 57 years of age, then have this test every 5 years. Have your cholesterol levels checked more often if: Your lipid or cholesterol levels are high. You are older than 57 years of age. You are at high risk for heart disease. What should I know about cancer screening? Depending on your health history and family history, you may need to have cancer screening at various ages. This may include screening for: Breast cancer. Cervical cancer. Colorectal cancer. Skin cancer. Lung cancer. What should I know about heart disease, diabetes, and high blood pressure? Blood pressure and heart disease High blood pressure causes heart disease and increases the risk of stroke. This is more likely to develop in people who have high blood pressure readings or are overweight. Have your blood pressure checked: Every 3-5 years if you are 77-21 years of age. Every year if you are 57 years old or older. Diabetes Have regular diabetes screenings. This checks your fasting blood sugar level. Have the screening done: Once every three years after age 52 if you are at a normal weight and have a low risk for diabetes. More often and at a younger age if you are overweight or have a high risk for diabetes. What should I know about preventing infection? Hepatitis B If you have a higher risk for hepatitis B, you  should be screened for this virus. Talk with your health care provider to find out if you are at risk for hepatitis B infection. Hepatitis C Testing is recommended for: Everyone born from [birth year] through 1965. Anyone with known risk factors for hepatitis C. Sexually transmitted infections (STIs) Get screened for STIs,  including gonorrhea and chlamydia, if: You are sexually active and are younger than 57 years of age. You are older than 57 years of age and your health care provider tells you that you are at risk for this type of infection. Your sexual activity has changed since you were last screened, and you are at increased risk for chlamydia or gonorrhea. Ask your health care provider if you are at risk. Ask your health care provider about whether you are at high risk for HIV. Your health care provider may recommend a prescription medicine to help prevent HIV infection. If you choose to take medicine to prevent HIV, you should first get tested for HIV. You should then be tested every 3 months for as long as you are taking the medicine. Pregnancy If you are about to stop having your period (premenopausal) and you may become pregnant, seek counseling before you get pregnant. Take 400 to 800 micrograms (mcg) of folic acid every day if you become pregnant. Ask for birth control (contraception) if you want to prevent pregnancy. Osteoporosis and menopause Osteoporosis is a disease in which the bones lose minerals and strength with aging. This can result in bone fractures. If you are 19 years old or older, or if you are at risk for osteoporosis and fractures, ask your health care provider if you should: Be screened for bone loss. Take a calcium or vitamin D supplement to lower your risk of fractures. Be given hormone replacement therapy (HRT) to treat symptoms of menopause. Follow these instructions at home: Alcohol use Do not drink alcohol if: Your health care provider tells you not to drink. You are pregnant, may be pregnant, or are planning to become pregnant. If you drink alcohol: Limit how much you have to: 0-1 drink a day. Know how much alcohol is in your drink. In the [place] one drink equals one 12 oz bottle of beer (355 mL), one 5 oz glass of wine (148 mL), or one 1 oz glass of hard liquor (44  mL). Lifestyle Do not use any products that contain nicotine or tobacco. These products include cigarettes, chewing tobacco, and vaping devices, such as e-cigarettes. If you need help quitting, ask your health care provider. Do not use street drugs. Do not share needles. Ask your health care provider for help if you need support or information about quitting drugs. General instructions Schedule regular health, dental, and eye exams. Stay current with your vaccines. Tell your health care provider if: You often feel depressed. You have ever been abused or do not feel safe at home. Summary Adopting a healthy lifestyle and getting preventive care are important in promoting health and wellness. Follow your health care provider's instructions about healthy diet, exercising, and getting tested or screened for diseases. Follow your health care provider's instructions on monitoring your cholesterol and blood pressure. This information is not intended to replace advice given to you by your health care provider. Make sure you discuss any questions you have with your health care provider. Document Revised: 07/12/2020 Document Reviewed: 07/12/2020 Elsevier Patient Education  [place].

## 2022-05-16 ENCOUNTER — Ambulatory Visit: Payer: BC Managed Care – PPO | Admitting: Nurse Practitioner

## 2022-05-16 LAB — HEMOGLOBIN A1C: Hgb A1c MFr Bld: 5.3 % (ref 4.6–6.5)

## 2022-05-16 LAB — BASIC METABOLIC PANEL
BUN: 19 mg/dL (ref 6–23)
CO2: 28 mEq/L (ref 19–32)
Calcium: 9.7 mg/dL (ref 8.4–10.5)
Chloride: 101 mEq/L (ref 96–112)
Creatinine, Ser: 0.79 mg/dL (ref 0.40–1.20)
GFR: 83.75 mL/min (ref >60.00)
Glucose, Bld: 85 mg/dL (ref 70–99)
Potassium: 3.5 mEq/L (ref 3.5–5.1)
Sodium: 138 mEq/L (ref 135–145)

## 2022-05-16 LAB — LIPID PANEL
Cholesterol: 157 mg/dL (ref 0–200)
HDL: 71.5 mg/dL (ref >39.00)
LDL Cholesterol: 79 mg/dL (ref 0–99)
NonHDL: 85.49
Total CHOL/HDL Ratio: 2
Triglycerides: 34 mg/dL (ref 0.0–149.0)
VLDL: 6.8 mg/dL (ref 0.0–40.0)

## 2022-05-16 LAB — VITAMIN B12: Vitamin B-12: 647 pg/mL (ref 211–911)

## 2022-05-16 LAB — HEPATITIS C ANTIBODY: Hepatitis C Ab: NONREACTIVE

## 2022-05-17 ENCOUNTER — Encounter: Payer: Self-pay | Admitting: Nurse Practitioner

## 2022-05-17 DIAGNOSIS — N951 Menopausal and female climacteric states: Secondary | ICD-10-CM

## 2022-05-18 ENCOUNTER — Other Ambulatory Visit: Payer: Self-pay | Admitting: Nurse Practitioner

## 2022-05-18 MED ORDER — ESTRADIOL 0.0375 MG/24HR TD PTTW: 1.0000 | MEDICATED_PATCH | TRANSDERMAL | 0 refills | Status: DC

## 2022-05-18 NOTE — Telephone Encounter (Signed)
Last AEX 05/12/21 Scheduled 06/13/2022

## 2022-05-19 ENCOUNTER — Encounter: Payer: Self-pay | Admitting: Physician Assistant

## 2022-06-13 ENCOUNTER — Ambulatory Visit (INDEPENDENT_AMBULATORY_CARE_PROVIDER_SITE_OTHER): Payer: BLUE CROSS/BLUE SHIELD | Admitting: Nurse Practitioner

## 2022-06-13 ENCOUNTER — Encounter: Payer: Self-pay | Admitting: Nurse Practitioner

## 2022-06-13 VITALS — BP 128/64 | HR 78 | Ht 67.5 in | Wt 166.0 lb

## 2022-06-13 DIAGNOSIS — N951 Menopausal and female climacteric states: Secondary | ICD-10-CM | POA: Diagnosis not present

## 2022-06-13 DIAGNOSIS — N952 Postmenopausal atrophic vaginitis: Secondary | ICD-10-CM

## 2022-06-13 DIAGNOSIS — Z79890 Hormone replacement therapy (postmenopausal): Secondary | ICD-10-CM | POA: Diagnosis not present

## 2022-06-13 DIAGNOSIS — Z01419 Encounter for gynecological examination (general) (routine) without abnormal findings: Secondary | ICD-10-CM | POA: Diagnosis not present

## 2022-06-13 MED ORDER — PROGESTERONE MICRONIZED 100 MG PO CAPS: 100.0000 mg | ORAL_CAPSULE | Freq: Every day | ORAL | 3 refills | Status: DC

## 2022-06-13 MED ORDER — ESTRADIOL 0.1 MG/GM VA CREA: 1.0000 | TOPICAL_CREAM | VAGINAL | 2 refills | Status: DC

## 2022-06-13 MED ORDER — ESTRADIOL 0.0375 MG/24HR TD PTTW: 1.0000 | MEDICATED_PATCH | TRANSDERMAL | 3 refills | Status: DC

## 2022-06-13 NOTE — Progress Notes (Signed)
Mary Jimenez [facility] 1965-12-04 [id number]   History:  57 y.o. [id number] presents for annual exam. Postmenopausal - on HRT. Increased estradiol last year due to worsening hot flashes and night sweats. These did improve but she has noticed very recently night sweats have increased again. Going through separation that has caused significant stress. She has recently started exercising. Seeing therapy regularly. Normal pap and mammogram history. Stopped vaginal estrogen months ago and had significant symptoms of urinary frequency. Restarted vaginal estrogen a couple of weeks ago.   Gynecologic History Patient's last menstrual period was 09/11/2019.   Contraception/Family planning: post menopausal status Sexually active: No  Health Maintenance Last Pap: 05/12/2021. Results were: Normal neg HPV, 5-year repeat Last mammogram: 2019. Results were: Normal Last colonoscopy: 2019. Results were: Normal Last Dexa: Not indicated  Past medical history, past surgical history, family history and social history were all reviewed and documented in the EPIC chart. Separated. 47 yo son, in grad school in [place]. Daughter lives local, married, has 63 yo son.   ROS:  A ROS was performed and pertinent positives and negatives are included.  Exam:  Vitals:   06/13/22 1322  BP: 128/64  Pulse: 78  SpO2: 100%  Weight: 166 lb (75.3 kg)  Height: 5' 7.5" (1.715 m)     Body mass index is 25.62 kg/m.  General appearance:  Normal Thyroid:  Symmetrical, normal in size, without palpable masses or nodularity. Respiratory  Auscultation:  Clear without wheezing or rhonchi Cardiovascular  Auscultation:  Regular rate, without rubs, murmurs or gallops  Edema/varicosities:  Not grossly evident Abdominal  Soft,nontender, without masses, guarding or rebound.  Liver/spleen:  No organomegaly noted  Hernia:  None appreciated  Skin  Inspection:  Grossly normal Breasts: Examined lying and sitting.   Right: Without masses,  retractions, discharge or axillary adenopathy.   Left: Without masses, retractions, discharge or axillary adenopathy. Genitourinary   Inguinal/mons:  Normal without inguinal adenopathy  External genitalia:  Normal appearing vulva with no masses, tenderness, or lesions  BUS/Urethra/Skene's glands:  Normal  Vagina:  Normal appearing with normal color and discharge, no lesions. Mild atrophic changes  Cervix:  Normal appearing without discharge or lesions  Uterus:  Normal in size, shape and contour.  Midline and mobile, nontender  Adnexa/parametria:     Rt: Normal in size, without masses or tenderness.   Lt: Normal in size, without masses or tenderness.  Anus and perineum: Normal  Digital rectal exam: Deferred  Patient informed chaperone available to be present for breast and pelvic exam. Patient has requested no chaperone to be present. Patient has been advised what will be completed during breast and pelvic exam.   Assessment/Plan:  57 y.o. [id number] for annual exam.   Well female exam with routine gynecological exam - Education provided on SBEs, importance of preventative screenings, current guidelines, high calcium diet, regular exercise, and multivitamin daily. Labs with PCP.   Postmenopausal hormone therapy - Plan: estradiol (VIVELLE-DOT) 0.0375 MG/24HR twice weekly, progesterone (PROMETRIUM) 100 MG capsule nightly. Discussed lifestyle changes to help with night sweats.   Menopausal vaginal dryness - Plan: estradiol (ESTRACE) 0.1 MG/GM vaginal cream twice weekly. Just restarted after having UTI-like symptoms after discontinuing.   Atrophic vaginitis - Plan: estradiol (ESTRACE) 0.1 MG/GM vaginal cream twice weekly.   Screening for cervical cancer - Normal Pap history. Will repeat at 5-year interval per guidelines.   Screening for breast cancer - Normal mammogram history.  She is overdue for mammogram. Discussed current guidelines and importance of  preventative screenings, especially  while on HRT. Information provided on local breast imaging centers. Normal breast exam today.  Screening for colon cancer - 2019 colonoscopy. Will repeat at GI's recommended interval.   Follow up in 1 year for annual.      Olivia Mackie [facility], 1:56 PM 06/13/2022

## 2022-07-03 ENCOUNTER — Encounter: Payer: Self-pay | Admitting: Physician Assistant

## 2022-07-03 ENCOUNTER — Ambulatory Visit: Payer: BLUE CROSS/BLUE SHIELD | Admitting: Physician Assistant

## 2022-07-03 VITALS — BP 122/82 | HR 68 | Ht 67.0 in | Wt 166.4 lb

## 2022-07-03 DIAGNOSIS — D1800 Hemangioma unspecified site: Secondary | ICD-10-CM | POA: Diagnosis not present

## 2022-07-03 DIAGNOSIS — R630 Anorexia: Secondary | ICD-10-CM | POA: Diagnosis not present

## 2022-07-03 DIAGNOSIS — R194 Change in bowel habit: Secondary | ICD-10-CM | POA: Diagnosis not present

## 2022-07-03 DIAGNOSIS — R6881 Early satiety: Secondary | ICD-10-CM | POA: Diagnosis not present

## 2022-07-03 DIAGNOSIS — D1803 Hemangioma of intra-abdominal structures: Secondary | ICD-10-CM

## 2022-07-03 DIAGNOSIS — Z86010 Personal history of colonic polyps: Secondary | ICD-10-CM

## 2022-07-03 DIAGNOSIS — R143 Flatulence: Secondary | ICD-10-CM

## 2022-07-03 NOTE — Progress Notes (Addendum)
Chief Complaint: Change in bowel habits, Decreased appetite, Early satiety  HPI:    Mary Jimenez is a 57 year old female with a past medical history as listed below including anxiety, depression and multiple others, who was referred to me by [place], Betty G, MD for a complaint of change in bowel habits.      05/15/2022 patient saw PCP and at that time described some "digestive issues", flatulence and softer stools for 4 to 5 months.  Discussed history of a polypectomy during a colonoscopy in 2019 in [place] which was benign.  At that time patient referred to [place].    05/15/2022 labs including B12, lipid panel, hepatitis C antibody, hemoglobin A1c and BMP were all normal.    Today, she has been vegan for the last 7 years of her life and always had pretty good digestive health but over the past year and a half things just "are not working right".  She tells me that everything is progressively getting worse.  She notes that she really has no appetite throughout the day at all and has to force herself to eat.  She will then force herself to start eating dinner and after she starts eating it then she can eat the full meal, but she feels full very fast and just full "all the time".  Tells me she typically has 1-2 bowel movements a day and they are "mushy and shreddy", nothing like she used to have.  Also notes "an aversion to water".  She tries to stay hydrated with other liquids but this is also new for her.  Describes a colonoscopy in 2019 in [place] with "a polyp", she was told it was benign but is not sure what it was exactly.    Does tell me that she is going through a divorce.  Currently separated for the past 10 months.  This has been very stressful for her and there is a lot of anxiety tied to this.    Also describes a past history of a liver hemangioma that was so large that she had to have it removed as it was pushing on her stomach and her rib cage    Denies fever, chills, blood in her stool,  nausea, vomiting or symptoms that awaken her from sleep.  Past Medical History:  Diagnosis Date   Anxiety    Asthma    Depression    History of chicken pox    History of colon polyps     Past Surgical History:  Procedure Laterality Date   CERVICAL POLYPECTOMY     HEMANGIOMA EXCISION  05/2012   liver hemangeoma removed and partial liver   TONSILLECTOMY AND ADENOIDECTOMY      Current Outpatient Medications  Medication Sig Dispense Refill   cetirizine (ZYRTEC) 10 MG tablet Take 10 mg by mouth daily.     estradiol (ESTRACE) 0.1 MG/GM vaginal cream Place 1 Applicatorful vaginally 2 (two) times a week. Insert 2 grams twice weekly 42.5 g 2   estradiol (VIVELLE-DOT) 0.0375 MG/24HR Place 1 patch onto the skin 2 (two) times a week. 24 patch 3   progesterone (PROMETRIUM) 100 MG capsule Take 1 capsule (100 mg total) by mouth daily. 90 capsule 3   triamcinolone cream (KENALOG) 0.1 % Apply 1 application  topically 2 (two) times daily as needed. 30 g 1   No current facility-administered medications for this visit.    Allergies as of 07/03/2022 - Review Complete 06/13/2022  Allergen Reaction Noted   Pseudoephedrine Other (See  Comments) 11/21/2017   Latex Itching 02/11/2018    Family History  Problem Relation Age of Onset   Arthritis Mother    Asthma Mother    Depression Mother    Hypertension Mother    Asthma Sister    Hypertension Sister    Miscarriages / [place] Sister    Cancer Maternal Aunt        Uterine   Alcohol abuse Maternal Grandmother    Cancer Maternal Grandmother        Lung   Cancer Maternal Grandfather        ? type    Social History   Socioeconomic History   Marital status: Legally Separated    Spouse name: Not on file   Number of children: 2   Years of education: Not on file   Highest education level: Some college, no degree  Occupational History   Not on file  Tobacco Use   Smoking status: Former    Packs/day: 1.00    Years: 7.00    Additional  pack years: 0.00    Total pack years: 7.00    Types: Cigarettes   Smokeless tobacco: Never   Tobacco comments:    smoked off and on  Vaping Use   Vaping Use: Never used  Substance and Sexual Activity   Alcohol use: Never   Drug use: Never   Sexual activity: Yes    Birth control/protection: Post-menopausal    Comment: 1st intercourse 57 yo-Fewer than 5 partners  Other Topics Concern   Not on file  Social History Narrative   Not on file   Social Determinants of Health   Financial Resource Strain: Medium Risk (08/15/2021)   Overall Financial Resource Strain (CARDIA)    Difficulty of Paying Living Expenses: Somewhat hard  Food Insecurity: Food Insecurity Present (08/15/2021)   Hunger Vital Sign    Worried About Running Out of Food in the Last Year: Sometimes true    Ran Out of Food in the Last Year: Patient declined  Transportation Needs: No Transportation Needs (08/15/2021)   PRAPARE - Administrator, Civil Service (Medical): No    Lack of Transportation (Non-Medical): No  Physical Activity: Insufficiently Active (08/15/2021)   Exercise Vital Sign    Days of Exercise per Week: 3 days    Minutes of Exercise per Session: 40 min  Stress: Stress Concern Present (08/15/2021)   [employer] of Occupational Health - Occupational Stress Questionnaire    Feeling of Stress : Very much  Social Connections: Socially Isolated (08/15/2021)   Social Connection and Isolation Panel [NHANES]    Frequency of Communication with Friends and Family: More than three times a week    Frequency of Social Gatherings with Friends and Family: More than three times a week    Attends Religious Services: Never    Database administrator or Organizations: No    Attends Engineer, structural: Not on file    Marital Status: Separated  Intimate Partner Violence: Not on file    Review of Systems:    Constitutional: No weight loss, fever or chills Skin: No rash Cardiovascular: No chest  pain  Respiratory: No SOB  Gastrointestinal: See HPI and otherwise negative Genitourinary: No dysuria  Neurological: No headache, dizziness or syncope Musculoskeletal: No new muscle or joint pain Hematologic: No bleeding  Psychiatric: +anxiety/depression   Physical Exam:  Vital signs: BP 122/82 (BP Location: Left Arm, Patient Position: Sitting, Cuff Size: Normal)   Pulse 68  Ht 5\' 7"  (1.702 m) Comment: height measured without shoes  Wt 166 lb 6 oz (75.5 kg)   LMP 09/11/2019   BMI 26.06 kg/m    Constitutional:   Pleasant Caucasian female appears to be in NAD, Well developed, Well nourished, alert and cooperative Head:  Normocephalic and atraumatic. Eyes:   PEERL, EOMI. No icterus. Conjunctiva pink. Ears:  Normal auditory acuity. Neck:  Supple Throat: Oral cavity and pharynx without inflammation, swelling or lesion.  Respiratory: Respirations even and unlabored. Lungs clear to auscultation bilaterally.   No wheezes, crackles, or rhonchi.  Cardiovascular: Normal S1, S2. No MRG. Regular rate and rhythm. No peripheral edema, cyanosis or pallor.  Gastrointestinal:  Soft, nondistended, nontender. No rebound or guarding. Normal bowel sounds. No appreciable masses or hepatomegaly. Rectal:  Not performed.  Msk:  Symmetrical without gross deformities. Without edema, no deformity or joint abnormality.  Neurologic:  Alert and  oriented x4;  grossly normal neurologically.  Skin:   Dry and intact without significant lesions or rashes. Psychiatric: Demonstrates good judgement and reason without abnormal affect or behaviors.  RELEVANT LABS AND IMAGING: CBC    Component Value Date/Time   WBC 4.3 05/12/2021 1246   RBC 4.29 05/12/2021 1246   HGB 12.9 05/12/2021 1246   HCT 38.9 05/12/2021 1246   PLT 215 05/12/2021 1246   MCV 90.7 05/12/2021 1246   MCH 30.1 05/12/2021 1246   MCHC 33.2 05/12/2021 1246   RDW 11.8 05/12/2021 1246   LYMPHSABS 1,066 05/12/2021 1246   MONOABS 0.5 11/21/2017 1650    EOSABS 112 05/12/2021 1246   BASOSABS 60 05/12/2021 1246    CMP     Component Value Date/Time   NA 138 05/15/2022 1551   K 3.5 05/15/2022 1551   CL 101 05/15/2022 1551   CO2 28 05/15/2022 1551   GLUCOSE 85 05/15/2022 1551   BUN 19 05/15/2022 1551   CREATININE 0.79 05/15/2022 1551   CREATININE 0.82 05/12/2021 1246   CALCIUM 9.7 05/15/2022 1551   PROT 6.5 05/12/2021 1246   ALBUMIN 4.7 01/21/2019 1217   AST 16 05/12/2021 1246   ALT 14 05/12/2021 1246   ALKPHOS 54 01/21/2019 1217   BILITOT 0.5 05/12/2021 1246    Assessment: 1.  Change in bowel habits: Towards decreased form and varying in appearance; after stressful life event to likely IBS 2.  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  Flatulence: Consider overlap of SIBO with IBS 4.  History of a colon polyp: Last colonoscopy in 2019 per patient with a benign polyp, we do not have results from this was done in [place] 5.  Liver hemangioma: Patient tells me she had a liver hemangioma so she had to have it removed because it was pushing on her stomach and her rib cage, attempting to get records  Plan: 1.  Scheduled patient for diagnostic EGD and colonoscopy in the [facility] with Dr. Leone Payor as he had next available.  She will continue to follow with Dr. Leone Payor as her primary GI physician here.  Did provide the detailed list of risks for the procedure and she agrees to proceed. Patient is appropriate for endoscopic procedure(s) in the ambulatory ([facility]) setting.  2.  Will attempt to get last colonoscopy records from [place], also records from liver hemangioma 3.  Discussed with patient that most likely this represents IBS +/- functional dyspepsia given her recent separation and divorce which is very stressful for her.  She agreed that it is likely playing a big  role in things but she is still worried. 4.  Discussed with patient that pending results from EGD and colonoscopy we can meet to  discuss IBS in greater detail. 5.  Patient to follow in clinic per recommendations from Dr. Leone Payor after time of procedure.  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  At that time resection was advised.  09/13/2017 colonoscopy with a normal-appearing ileum to 4 cm, diminutive sessile cecal polyp and a 12 mm flat ascending colon polyp.  Repeat recommended in 3 years.  Pathology showed sessile serrated polyp in the ascending colon and focal lymphoid tissue from the cecum.  No change to recommendations for EGD and colonoscopy.  Patient is actually due for colonoscopy for polyp surveillance as well.  Hyacinth Meeker, PA-C

## 2022-07-03 NOTE — Patient Instructions (Signed)
You have been scheduled for a colonoscopy. Please follow written instructions given to you at your visit today.  Please pick up your prep supplies at the pharmacy within the next 1-3 days. If you use inhalers (even only as needed), please bring them with you on the day of your procedure.  _______________________________________________________  If your blood pressure at your visit was 140/90 or greater, please contact your primary care physician to follow up on this.  _______________________________________________________  If you are age 57 or older, your body mass index should be between 23-30. Your Body mass index is 26.06 kg/m. If this is out of the aforementioned range listed, please consider follow up with your Primary Care Provider.  If you are age 62 or younger, your body mass index should be between 19-25. Your Body mass index is 26.06 kg/m. If this is out of the aformentioned range listed, please consider follow up with your Primary Care Provider.   ________________________________________________________  The [facility] providers would like to encourage you to use [facility] to communicate with providers for non-urgent requests or questions.  Due to long hold times on the telephone, sending your provider a message by [facility] may be a faster and more efficient way to get a response.  Please allow 48 business hours for a response.  Please remember that this is for non-urgent requests.  _______________________________________________________

## 2022-07-07 ENCOUNTER — Encounter: Payer: BLUE CROSS/BLUE SHIELD | Admitting: Internal Medicine

## 2022-07-12 ENCOUNTER — Encounter: Payer: Self-pay | Admitting: Family Medicine

## 2022-07-12 DIAGNOSIS — D1803 Hemangioma of intra-abdominal structures: Secondary | ICD-10-CM | POA: Insufficient documentation

## 2022-07-17 ENCOUNTER — Ambulatory Visit: Payer: BLUE CROSS/BLUE SHIELD

## 2022-07-21 ENCOUNTER — Encounter: Payer: Self-pay | Admitting: Internal Medicine

## 2022-07-21 ENCOUNTER — Ambulatory Visit (AMBULATORY_SURGERY_CENTER): Payer: BLUE CROSS/BLUE SHIELD | Admitting: Internal Medicine

## 2022-07-21 ENCOUNTER — Other Ambulatory Visit: Payer: BLUE CROSS/BLUE SHIELD

## 2022-07-21 VITALS — BP 125/80 | HR 62 | Temp 97.8°F | Resp 14 | Ht 67.0 in | Wt 166.0 lb

## 2022-07-21 DIAGNOSIS — R6881 Early satiety: Secondary | ICD-10-CM

## 2022-07-21 DIAGNOSIS — R194 Change in bowel habit: Secondary | ICD-10-CM

## 2022-07-21 DIAGNOSIS — Z86010 Personal history of colonic polyps: Secondary | ICD-10-CM

## 2022-07-21 MED ORDER — SODIUM CHLORIDE 0.9 % IV SOLN
500.0000 mL | Freq: Once | INTRAVENOUS | Status: DC
Start: 2022-07-21 — End: 2022-07-21

## 2022-07-21 NOTE — Progress Notes (Signed)
Report to PACU, RN, vss, BBS= Clear.  

## 2022-07-21 NOTE — Op Note (Addendum)
[facility] Patient Name: Mary Jimenez Procedure Date: 07/21/2022 2:38 PM MRN: [id number] Endoscopist: Iva Boop , MD, [id number] Age: 57 Referring MD:  Date of Birth: 1965-08-10 Gender: Female Account #: [id number] Procedure:                Colonoscopy Indications:              Change in bowel habits Medicines:                Monitored Anesthesia Care Procedure:                Pre-Anesthesia Assessment:                           - Prior to the procedure, a History and Physical                            was performed, and patient medications and                            allergies were reviewed. The patient's tolerance of                            previous anesthesia was also reviewed. The risks                            and benefits of the procedure and the sedation                            options and risks were discussed with the patient.                            All questions were answered, and informed consent                            was obtained. Prior Anticoagulants: The patient has                            taken no anticoagulant or antiplatelet agents. ASA                            Grade Assessment: II - A patient with mild systemic                            disease. After reviewing the risks and benefits,                            the patient was deemed in satisfactory condition to                            undergo the procedure.                           After obtaining informed consent, the colonoscope  was passed under direct vision. Throughout the                            procedure, the patient's blood pressure, pulse, and                            oxygen saturations were monitored continuously. The                            [id number]-[id number] Colonoscope [id number] was introduced                            through the anus and advanced to the the cecum,                            identified by appendiceal orifice and  ileocecal                            valve. The colonoscopy was performed without                            difficulty. The patient tolerated the procedure                            well. The quality of the bowel preparation was                            good. The ileocecal valve, appendiceal orifice, and                            rectum were photographed. The bowel preparation                            used was Miralax via split dose instruction. Scope In: 2:48:51 PM Scope Out: 2:59:36 PM Scope Withdrawal Time: 0 hours 7 minutes 42 seconds  Total Procedure Duration: 0 hours 10 minutes 45 seconds  Findings:                 The perianal and digital rectal examinations were                            normal.                           The entire examined colon appeared normal on direct                            and retroflexion views. Complications:            No immediate complications. Estimated Blood Loss:     Estimated blood loss: none. Impression:               - The entire examined colon is normal on direct and  retroflexion views though there was one                            hypertrophied anal papilla (not an issue)                           - No specimens collected. Recommendation:           - Patient has a contact number available for                            emergencies. The signs and symptoms of potential                            delayed complications were discussed with the                            patient. Return to normal activities tomorrow.                            Written discharge instructions were provided to the                            patient.                           - Resume previous diet.                           - Continue present medications.                           - Repeat colonoscopy in 5 years for surveillance.                            Hx 12 mm ssp 2019 ([facility])                           - Consider duloxetine  tx vs buspirone - I favor                            duloxetine - we discussed in recovery - has dx                            depression + stressors and early satiety and IBS                            sxs - she can get from PCP vs call me and I can                            arrange and set up f/u                           - Will check celiac labs also Iva Boop, MD 07/21/2022 3:12:21 PM This report has been signed  electronically.

## 2022-07-21 NOTE — Progress Notes (Signed)
History and Physical Interval Note:  07/21/2022 2:35 PM  Mary Jimenez  has presented today for endoscopic procedure(s), with the diagnosis of  Encounter Diagnoses  Name Primary?   Change in bowel habits Yes   Early satiety   .  The various methods of evaluation and treatment have been discussed with the patient and/or family. After consideration of risks, benefits and other options for treatment, the patient has consented to  the endoscopic procedure(s).   The patient's history has been reviewed, patient examined, no change in status, stable for endoscopic procedure(s).  I have reviewed the patient's chart and labs.  Questions were answered to the patient's satisfaction.     Iva Boop, MD, [facility]

## 2022-07-21 NOTE — Patient Instructions (Addendum)
The upper endoscopy and colonoscopy and both normal.  Stress and depression are known causes of digestive symptoms and that may be driving your symptoms.   As we discussed I think you should consider trying duloxetine, or buspirone but duloxetine would be my first choice.  I appreciate the opportunity to care for you. Iva Boop, MD, [facility]    Repeat routine colonoscopy in 5 yrs  Continue previous diet & medications   No biopsies done / No specimens taken  YOU HAD AN ENDOSCOPIC PROCEDURE TODAY AT THE [facility]:   Refer to the procedure report that was given to you for any specific questions about what was found during the examination.  If the procedure report does not answer your questions, please call your gastroenterologist to clarify.  If you requested that your care partner not be given the details of your procedure findings, then the procedure report has been included in a sealed envelope for you to review at your convenience later.  YOU SHOULD EXPECT: Some feelings of bloating in the abdomen. Passage of more gas than usual.  Walking can help get rid of the air that was put into your GI tract during the procedure and reduce the bloating. If you had a lower endoscopy (such as a colonoscopy or flexible sigmoidoscopy) you may notice spotting of blood in your stool or on the toilet paper. If you underwent a bowel prep for your procedure, you may not have a normal bowel movement for a few days.  Please Note:  You might notice some irritation and congestion in your nose or some drainage.  This is from the oxygen used during your procedure.  There is no need for concern and it should clear up in a day or so.  SYMPTOMS TO REPORT IMMEDIATELY:  Following lower endoscopy (colonoscopy or flexible sigmoidoscopy):  Excessive amounts of blood in the stool  Significant tenderness or worsening of abdominal pains  Swelling of the abdomen that is new, acute  Fever of 100F or  higher  Following upper endoscopy (EGD)  Vomiting of blood or coffee ground material  New chest pain or pain under the shoulder blades  Painful or persistently difficult swallowing  New shortness of breath  Fever of 100F or higher  Black, tarry-looking stools  For urgent or emergent issues, a gastroenterologist can be reached at any hour by calling [phone]. Do not use MyChart messaging for urgent concerns.    DIET:  We do recommend a small meal at first, but then you may proceed to your regular diet.  Drink plenty of fluids but you should avoid alcoholic beverages for 24 hours.  ACTIVITY:  You should plan to take it easy for the rest of today and you should NOT DRIVE or use heavy machinery until tomorrow (because of the sedation medicines used during the test).    FOLLOW UP: Our staff will call the number listed on your records the next business day following your procedure.  We will call around 7:15- 8:00 am to check on you and address any questions or concerns that you may have regarding the information given to you following your procedure. If we do not reach you, we will leave a message.     If any biopsies were taken you will be contacted by phone or by letter within the next 1-3 weeks.  Please call us at [phone] if you have not heard about the biopsies in 3 weeks.    SIGNATURES/CONFIDENTIALITY: You and/or  your care partner have signed paperwork which will be entered into your electronic medical record.  These signatures attest to the fact that that the information above on your After Visit Summary has been reviewed and is understood.  Full responsibility of the confidentiality of this discharge information lies with you and/or your care-partner.

## 2022-07-21 NOTE — Progress Notes (Signed)
Pt's states no medical or surgical changes since previsit or office visit. 

## 2022-07-21 NOTE — Op Note (Signed)
[facility] Patient Name: Mary Jimenez Procedure Date: 07/21/2022 2:39 PM MRN: [id number] Endoscopist: Iva Boop , MD, [id number] Age: 57 Referring MD:  Date of Birth: 28-Jul-1965 Gender: Female Account #: [id number] Procedure:                Upper GI endoscopy Indications:              Early satiety Medicines:                Monitored Anesthesia Care Procedure:                Pre-Anesthesia Assessment:                           - Prior to the procedure, a History and Physical                            was performed, and patient medications and                            allergies were reviewed. The patient's tolerance of                            previous anesthesia was also reviewed. The risks                            and benefits of the procedure and the sedation                            options and risks were discussed with the patient.                            All questions were answered, and informed consent                            was obtained. Prior Anticoagulants: The patient has                            taken no anticoagulant or antiplatelet agents. ASA                            Grade Assessment: II - A patient with mild systemic                            disease. After reviewing the risks and benefits,                            the patient was deemed in satisfactory condition to                            undergo the procedure.                           After obtaining informed consent, the endoscope was  passed under direct vision. Throughout the                            procedure, the patient's blood pressure, pulse, and                            oxygen saturations were monitored continuously. The                            GIF [id number] #[id number] was introduced through the                            mouth, and advanced to the second part of duodenum.                            The upper GI endoscopy was accomplished  without                            difficulty. The patient tolerated the procedure                            well. Scope In: Scope Out: Findings:                 The esophagus was normal.                           The stomach was normal.                           The examined duodenum was normal.                           The cardia and gastric fundus were normal on                            retroflexion. Complications:            No immediate complications. Estimated Blood Loss:     Estimated blood loss: none. Impression:               - Normal esophagus.                           - Normal stomach. It is J-shaped (normal variant)                           - Normal examined duodenum.                           - No specimens collected. Recommendation:           - Patient has a contact number available for                            emergencies. The signs and symptoms of potential  delayed complications were discussed with the                            patient. Return to normal activities tomorrow.                            Written discharge instructions were provided to the                            patient.                           - Continue present medications.                           - Resume previous diet.                           - See the other procedure note for documentation of                            additional recommendations. Iva Boop, MD 07/21/2022 3:08:46 PM This report has been signed electronically.

## 2022-07-21 NOTE — Progress Notes (Unsigned)
History and Physical Interval Note:  07/21/2022 2:42 PM  Mary Jimenez  has presented today for endoscopic procedure(s), with the diagnosis of  Encounter Diagnoses  Name Primary?   Change in bowel habits Yes   Early satiety   .  The various methods of evaluation and treatment have been discussed with the patient and/or family. After consideration of risks, benefits and other options for treatment, the patient has consented to  the endoscopic procedure(s).   The patient's history has been reviewed, patient examined, no change in status, stable for endoscopic procedure(s).  I have reviewed the patient's chart and labs.  Questions were answered to the patient's satisfaction.     Iva Boop, MD, [facility]

## 2022-07-22 LAB — TISSUE TRANSGLUTAMINASE, IGA: (tTG) Ab, IgA: <1 U/mL

## 2022-07-22 LAB — IGA: Immunoglobulin A: 67 mg/dL (ref 47–310)

## 2022-07-24 ENCOUNTER — Telehealth: Payer: Self-pay

## 2022-07-24 NOTE — Telephone Encounter (Signed)
Follow up call placed, VM obtained and message left. 

## 2022-08-02 ENCOUNTER — Ambulatory Visit (INDEPENDENT_AMBULATORY_CARE_PROVIDER_SITE_OTHER): Payer: BLUE CROSS/BLUE SHIELD

## 2022-08-02 DIAGNOSIS — Z23 Encounter for immunization: Secondary | ICD-10-CM | POA: Diagnosis not present

## 2022-09-26 NOTE — Progress Notes (Deleted)
HPI:  Ms.Cadey Dawnetta Copenhaver is a 57 y.o. female, who is here today to discuss starting Wellbutrin for depression.  Review of Systems See other pertinent positives and negatives in HPI.  Current Outpatient Medications on File Prior to Visit  Medication Sig Dispense Refill   albuterol (VENTOLIN HFA) 108 (90 Base) MCG/ACT inhaler Inhale into the lungs.     cetirizine (ZYRTEC) 10 MG tablet Take 10 mg by mouth daily.     cyanocobalamin (VITAMIN B12) 500 MCG tablet Take by mouth.     estradiol (ESTRACE) 0.1 MG/GM vaginal cream Place 1 Applicatorful vaginally 2 (two) times a week. Insert 2 grams twice weekly 42.5 g 2   estradiol (VIVELLE-DOT) 0.0375 MG/24HR Place 1 patch onto the skin 2 (two) times a week. 24 patch 3   Multiple Vitamin (MULTIVITAMIN) capsule Take 1 capsule by mouth daily.     progesterone (PROMETRIUM) 100 MG capsule Take 1 capsule (100 mg total) by mouth daily. 90 capsule 3   triamcinolone cream (KENALOG) 0.1 % Apply 1 application  topically 2 (two) times daily as needed. 30 g 1   No current facility-administered medications on file prior to visit.    Past Medical History:  Diagnosis Date   Anal fissure    Anemia    Anxiety    Asthma    Depression    History of chicken pox    History of colon polyps    Hx of sessile serrated colonic polyp 09/17/2017   12 mm ssp   Allergies  Allergen Reactions   Pseudoephedrine Other (See Comments)    Actifed caused hallucinations as a child   Latex Itching    Social History   Socioeconomic History   Marital status: Legally Separated    Spouse name: Not on file   Number of children: 2   Years of education: Not on file   Highest education level: Some college, no degree  Occupational History   Not on file  Tobacco Use   Smoking status: Former    Current packs/day: 0.00    Average packs/day: 1 pack/day for 7.0 years (7.0 ttl pk-yrs)    Types: Cigarettes    Start date: 21    Quit date: 11    Years since quitting:  29.5   Smokeless tobacco: Never   Tobacco comments:    smoked off and on  Vaping Use   Vaping status: Never Used  Substance and Sexual Activity   Alcohol use: Not Currently    Comment: occasional beer   Drug use: Never   Sexual activity: Yes    Birth control/protection: Post-menopausal    Comment: 1st intercourse 57 yo-Fewer than 5 partners  Other Topics Concern   Not on file  Social History Narrative   Not on file   Social Determinants of Health   Financial Resource Strain: Medium Risk (08/15/2021)   Overall Financial Resource Strain (CARDIA)    Difficulty of Paying Living Expenses: Somewhat hard  Food Insecurity: Food Insecurity Present (08/15/2021)   Hunger Vital Sign    Worried About Running Out of Food in the Last Year: Sometimes true    Ran Out of Food in the Last Year: Patient declined  Transportation Needs: No Transportation Needs (08/15/2021)   PRAPARE - Administrator, Civil Service (Medical): No    Lack of Transportation (Non-Medical): No  Physical Activity: Insufficiently Active (08/15/2021)   Exercise Vital Sign    Days of Exercise per Week: 3 days  Minutes of Exercise per Session: 40 min  Stress: Stress Concern Present (08/15/2021)   [employer] of Occupational Health - Occupational Stress Questionnaire    Feeling of Stress : Very much  Social Connections: Socially Isolated (08/15/2021)   Social Connection and Isolation Panel [NHANES]    Frequency of Communication with Friends and Family: More than three times a week    Frequency of Social Gatherings with Friends and Family: More than three times a week    Attends Religious Services: Never    Database administrator or Organizations: No    Attends Engineer, structural: Not on file    Marital Status: Separated    There were no vitals filed for this visit. There is no height or weight on file to calculate BMI.  Physical Exam  ASSESSMENT AND PLAN:  There are no diagnoses linked to  this encounter.  No orders of the defined types were placed in this encounter.   No problem-specific Assessment & Plan notes found for this encounter.   No follow-ups on file.  Betty G. [place], MD  [facility]. [place] office.

## 2022-09-27 ENCOUNTER — Ambulatory Visit: Payer: BLUE CROSS/BLUE SHIELD | Admitting: Family Medicine

## 2022-09-30 IMAGING — DX DG LUMBAR SPINE COMPLETE 4+V
5 series · 5 of 5 positions shown · non-contrast
Comparison: None Available.

CLINICAL DATA: Severe lower back pain, not radiating.

EXAM:
LUMBAR SPINE - COMPLETE 4+ VIEW

[lumbar spine ap]
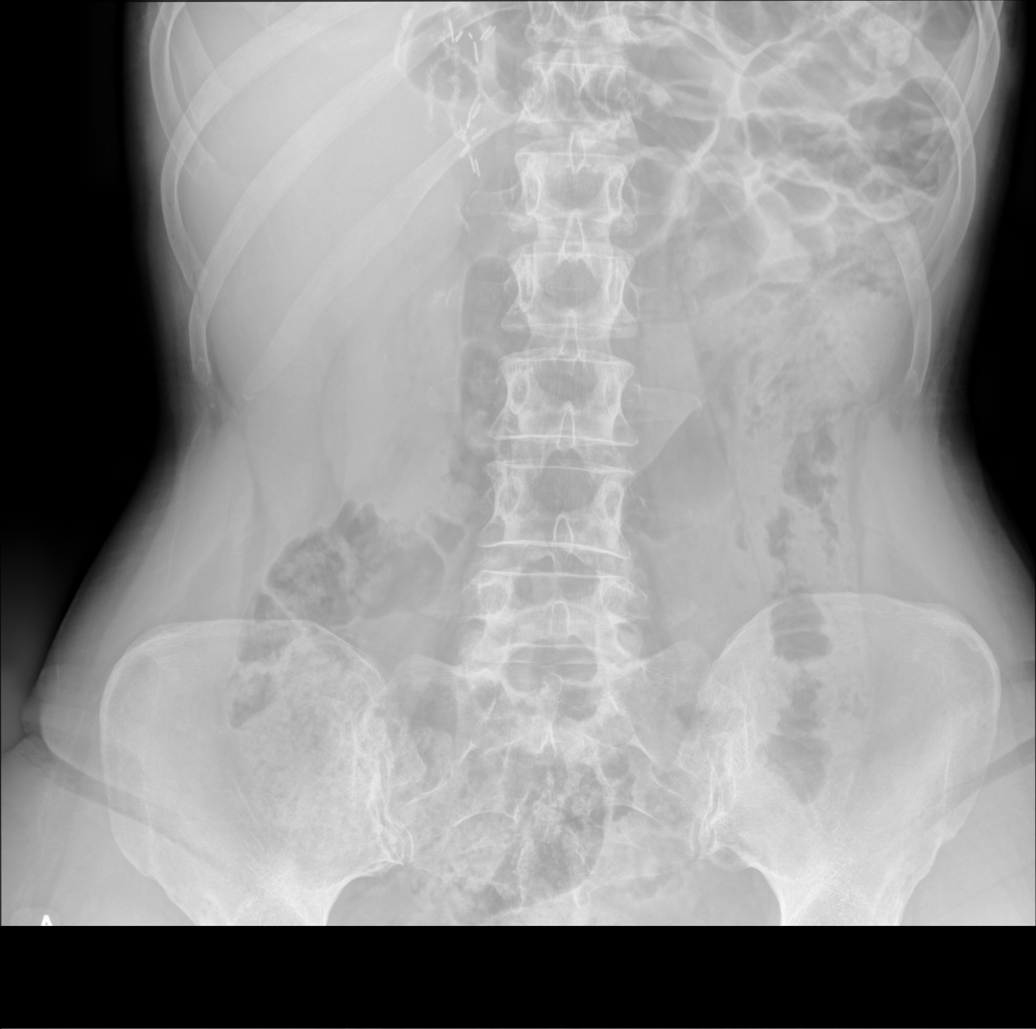

[lumbar spine oblique (1 of 2)]
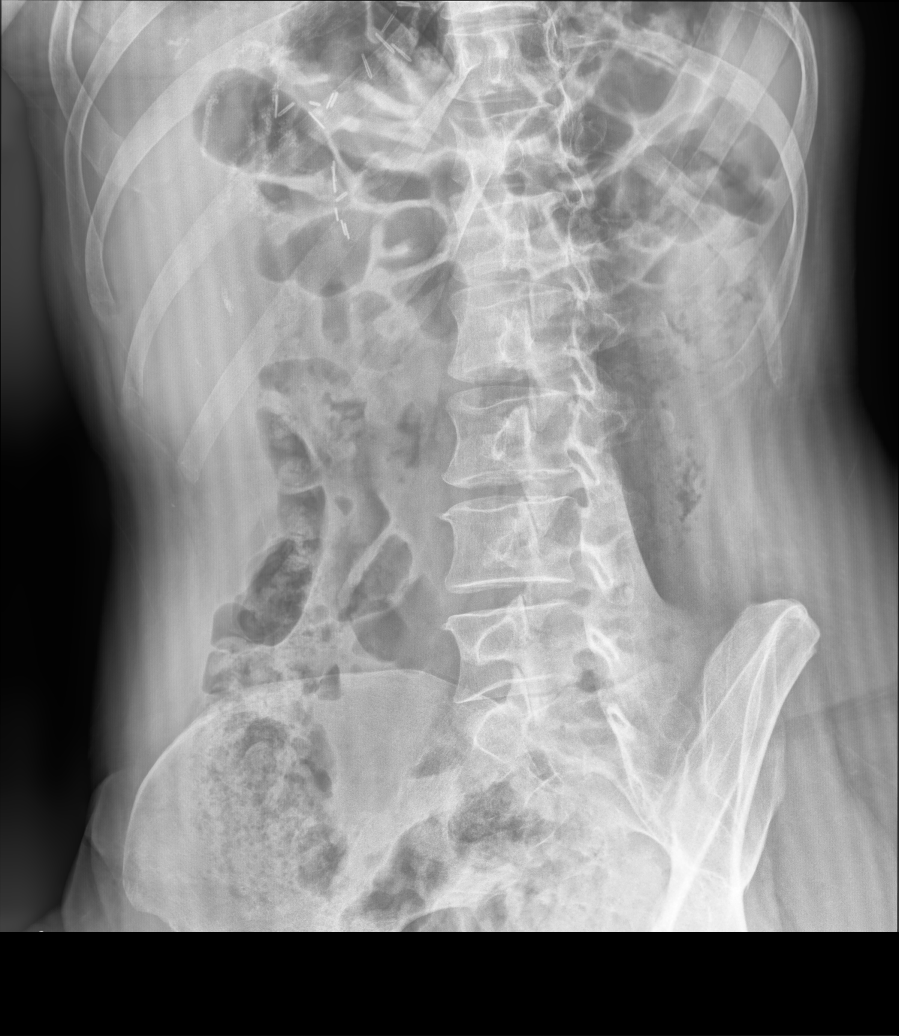

[lumbar spine oblique (2 of 2)]
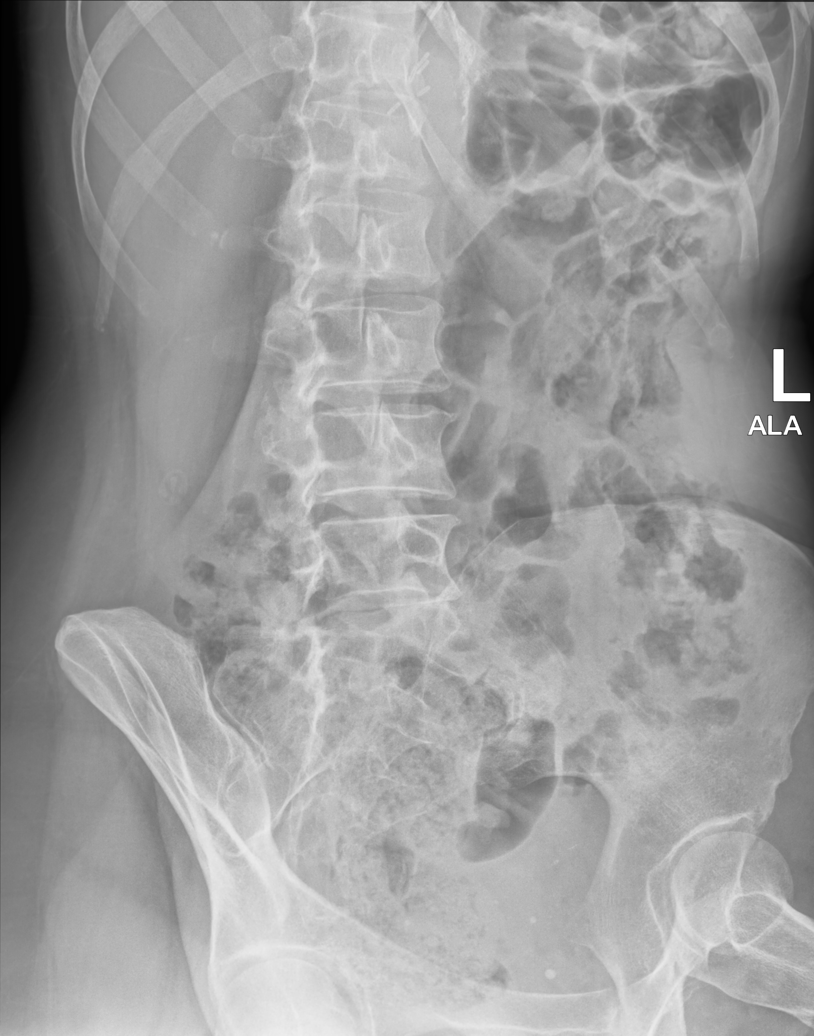

[lumbar spine lat (1 of 2)]
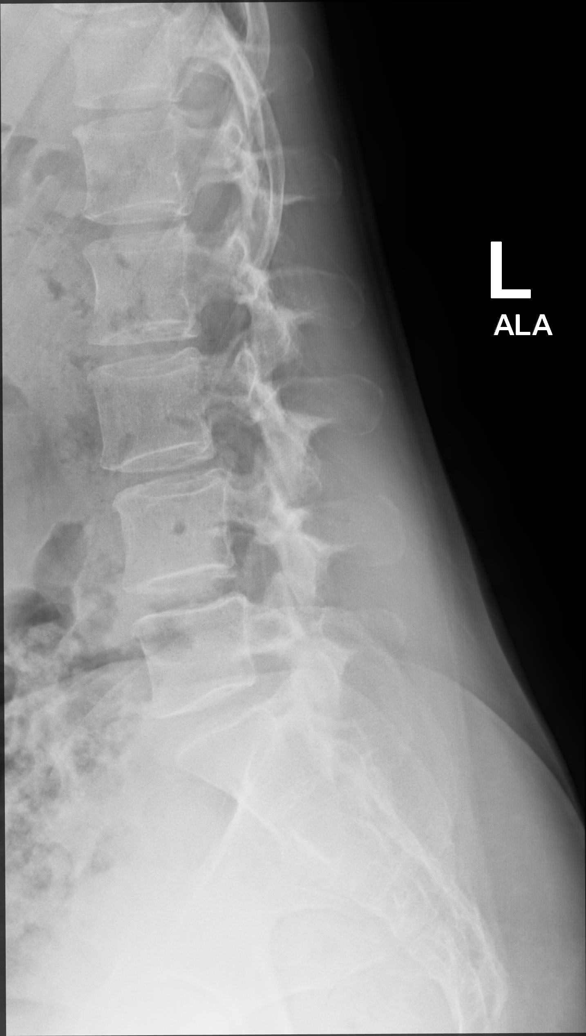

[lumbar spine lat (2 of 2)]
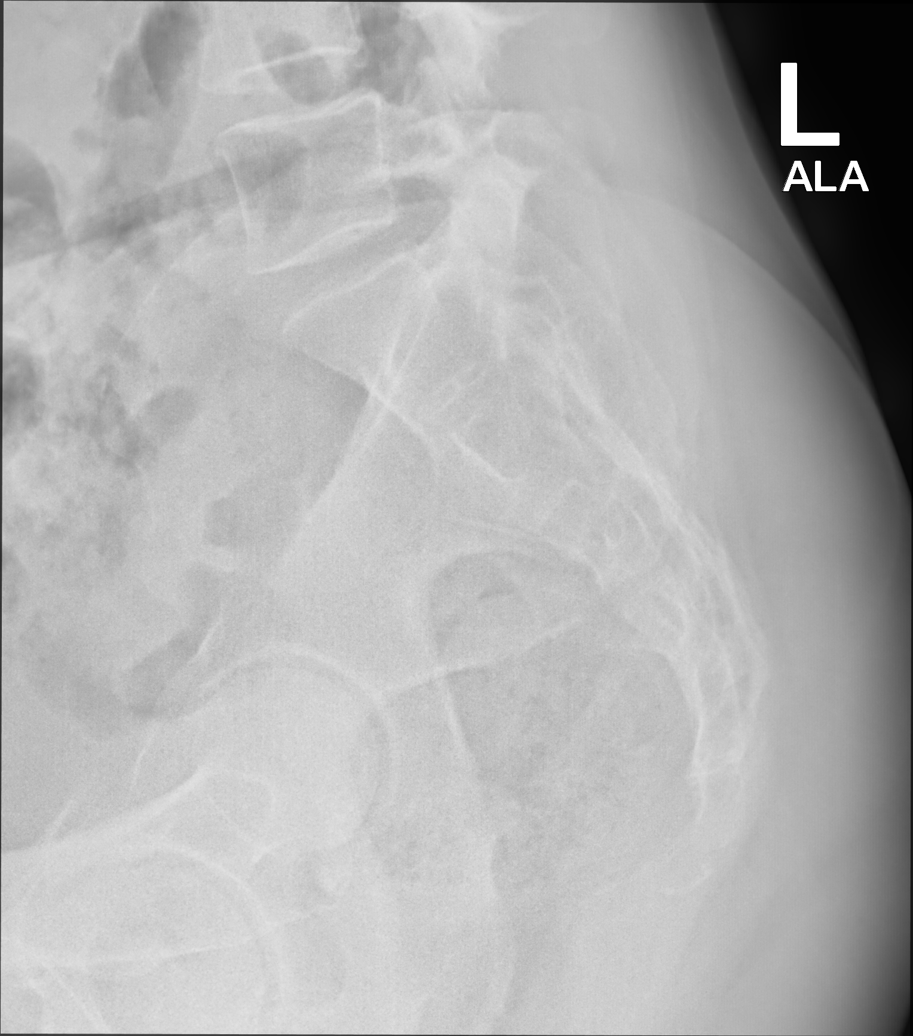

[5 of 5 positions shown; findings below may reference images not displayed]

FINDINGS: There is no evidence of lumbar spine fracture. Approximately 2 mm
anterolisthesis of the L3 vertebral body is noted on L4.
Intervertebral disc spaces are maintained. Radiopaque surgical
sutures and surgical clips are seen within the right upper quadrant.
IMPRESSION: 1. Approximally 2 mm anterolisthesis of L3 on L4.
2. No acute osseous abnormality within the lumbar spine.

## 2022-10-04 NOTE — Progress Notes (Deleted)
HPI:  Ms.Jennilyn Nelsy Yarn is a 57 y.o. female, who is here today to discuss starting Wellbutrin for depression.  Review of Systems See other pertinent positives and negatives in HPI.  Current Outpatient Medications on File Prior to Visit  Medication Sig Dispense Refill   albuterol (VENTOLIN HFA) 108 (90 Base) MCG/ACT inhaler Inhale into the lungs.     cetirizine (ZYRTEC) 10 MG tablet Take 10 mg by mouth daily.     cyanocobalamin (VITAMIN B12) 500 MCG tablet Take by mouth.     estradiol (ESTRACE) 0.1 MG/GM vaginal cream Place 1 Applicatorful vaginally 2 (two) times a week. Insert 2 grams twice weekly 42.5 g 2   estradiol (VIVELLE-DOT) 0.0375 MG/24HR Place 1 patch onto the skin 2 (two) times a week. 24 patch 3   Multiple Vitamin (MULTIVITAMIN) capsule Take 1 capsule by mouth daily.     progesterone (PROMETRIUM) 100 MG capsule Take 1 capsule (100 mg total) by mouth daily. 90 capsule 3   triamcinolone cream (KENALOG) 0.1 % Apply 1 application  topically 2 (two) times daily as needed. 30 g 1   No current facility-administered medications on file prior to visit.    Past Medical History:  Diagnosis Date   Anal fissure    Anemia    Anxiety    Asthma    Depression    History of chicken pox    History of colon polyps    Hx of sessile serrated colonic polyp 09/17/2017   12 mm ssp   Allergies  Allergen Reactions   Pseudoephedrine Other (See Comments)    Actifed caused hallucinations as a child   Latex Itching    Social History   Socioeconomic History   Marital status: Legally Separated    Spouse name: Not on file   Number of children: 2   Years of education: Not on file   Highest education level: Some college, no degree  Occupational History   Not on file  Tobacco Use   Smoking status: Former    Current packs/day: 0.00    Average packs/day: 1 pack/day for 7.0 years (7.0 ttl pk-yrs)    Types: Cigarettes    Start date: 38    Quit date: 87    Years since quitting:  29.6   Smokeless tobacco: Never   Tobacco comments:    smoked off and on  Vaping Use   Vaping status: Never Used  Substance and Sexual Activity   Alcohol use: Not Currently    Comment: occasional beer   Drug use: Never   Sexual activity: Yes    Birth control/protection: Post-menopausal    Comment: 1st intercourse 57 yo-Fewer than 5 partners  Other Topics Concern   Not on file  Social History Narrative   Not on file   Social Determinants of Health   Financial Resource Strain: Medium Risk (08/15/2021)   Overall Financial Resource Strain (CARDIA)    Difficulty of Paying Living Expenses: Somewhat hard  Food Insecurity: Food Insecurity Present (08/15/2021)   Hunger Vital Sign    Worried About Running Out of Food in the Last Year: Sometimes true    Ran Out of Food in the Last Year: Patient declined  Transportation Needs: No Transportation Needs (08/15/2021)   PRAPARE - Administrator, Civil Service (Medical): No    Lack of Transportation (Non-Medical): No  Physical Activity: Insufficiently Active (08/15/2021)   Exercise Vital Sign    Days of Exercise per Week: 3 days  Minutes of Exercise per Session: 40 min  Stress: Stress Concern Present (08/15/2021)   [employer] of Occupational Health - Occupational Stress Questionnaire    Feeling of Stress : Very much  Social Connections: Socially Isolated (08/15/2021)   Social Connection and Isolation Panel [NHANES]    Frequency of Communication with Friends and Family: More than three times a week    Frequency of Social Gatherings with Friends and Family: More than three times a week    Attends Religious Services: Never    Database administrator or Organizations: No    Attends Engineer, structural: Not on file    Marital Status: Separated    There were no vitals filed for this visit. There is no height or weight on file to calculate BMI.  Physical Exam  ASSESSMENT AND PLAN:  There are no diagnoses linked to  this encounter.  No orders of the defined types were placed in this encounter.   No problem-specific Assessment & Plan notes found for this encounter.   No follow-ups on file.  Betty G. [place], MD  [facility]. [place] office.

## 2022-10-06 ENCOUNTER — Ambulatory Visit: Payer: BLUE CROSS/BLUE SHIELD | Admitting: Family Medicine

## 2022-10-09 NOTE — Progress Notes (Unsigned)
HPI: Mary Jimenez is a 57 y.o. female, who is here today for follow up. She was last seen on 05/15/22 for her CPE. No new problems since her last visit. In 08/2021 Wellbutrin SR 75 mg bid was recommended for worsening depression, she did not take medication at that time. She decided to try medication and has been taking it for 3 weeks and reports significant improvement in depressive symptoms.  Denies feelings of worthlessness or hopelessness. She has also noted that medication has helped with concentration and energy, more so when she first started medication. She is still  under a lot of stress due to an ongoing divorce process.    She denies any increase in anxiety since starting Wellbutrin, although she experienced initial jitteriness that resolved after a few days. She has tried Prozac for depression and trazodone for sleep.  Her current sleep quality is not great but "not terrible", with approximately 8 hours of broken sleep per night.  She is seeing a licensed therapist once a week or every other week.  She is living alone, separated from husband for 1 year, she has a good support from her children.  She mentions high alcohol consumption until a few months ago, used to drink 1/2 battle of wine at the time and daily. She is concerned about possible adverse effects if she drinks one glass of wine if she has a social event.      10/10/2022    3:45 PM 05/15/2022    3:58 PM 08/15/2021    3:09 PM 07/08/2021   12:34 PM  Depression screen PHQ 2/9  Decreased Interest 0 1 2 2   Down, Depressed, Hopeless 0 1 2 3   PHQ - 2 Score 0 2 4 5   Altered sleeping 1 1 3 2   Tired, decreased energy 1 1 3 3   Change in appetite 0 0 3 3  Feeling bad or failure about yourself  0 1 2 2   Trouble concentrating 1 1 3 3   Moving slowly or fidgety/restless 0 0 3 3  Suicidal thoughts 0 0 0 0  PHQ-9 Score 3 6 21 21   Difficult doing work/chores Somewhat difficult Somewhat difficult Very difficult        10/10/2022    3:45 PM 05/15/2022    3:58 PM  GAD 7 : Generalized Anxiety Score  Nervous, Anxious, on Edge 0 1  Control/stop worrying 0 1  Worry too much - different things 0 1  Trouble relaxing 0 0  Restless 1 0  Easily annoyed or irritable 0 0  Afraid - awful might happen 0 1  Total GAD 7 Score 1 4  Anxiety Difficulty Not difficult at all Very difficult   Reports experiencing bilateral tinnitus for the past 3-4 months, which has improved but still present, especially in the morning and at night. First noted while she was in bed and had an episode of palpitation. Denies any CP, SOB,palpitations,or diaphoresis with exertion.  Denies hearing changes,dizziness, or unusual headaches.    She reports a "pinched nerve" sensation in the left upper back, described as numb or tingly, not painful.No associated weakness, she has not noted any rash. This has happened for a couple months.  Review of Systems  Constitutional:  Positive for fatigue. Negative for activity change, appetite change and fever.  HENT:  Negative for mouth sores, nosebleeds and sore throat.   Respiratory:  Negative for cough and wheezing.   Cardiovascular:  Negative for leg swelling.  Gastrointestinal:  Negative for  abdominal pain, nausea and vomiting.       Negative for changes in bowel habits.  Endocrine: Negative for cold intolerance and heat intolerance.  Genitourinary:  Negative for decreased urine volume, difficulty urinating, dysuria and hematuria.  Neurological:  Negative for syncope, facial asymmetry and weakness.  Psychiatric/Behavioral:  Negative for confusion and hallucinations. The patient is nervous/anxious.   See other pertinent positives and negatives in HPI.  Current Outpatient Medications on File Prior to Visit  Medication Sig Dispense Refill   albuterol (VENTOLIN HFA) 108 (90 Base) MCG/ACT inhaler Inhale into the lungs.     cetirizine (ZYRTEC) 10 MG tablet Take 10 mg by mouth daily.     cyanocobalamin  (VITAMIN B12) 500 MCG tablet Take by mouth.     estradiol (ESTRACE) 0.1 MG/GM vaginal cream Place 1 Applicatorful vaginally 2 (two) times a week. Insert 2 grams twice weekly 42.5 g 2   estradiol (VIVELLE-DOT) 0.0375 MG/24HR Place 1 patch onto the skin 2 (two) times a week. 24 patch 3   Multiple Vitamin (MULTIVITAMIN) capsule Take 1 capsule by mouth daily.     progesterone (PROMETRIUM) 100 MG capsule Take 1 capsule (100 mg total) by mouth daily. 90 capsule 3   triamcinolone cream (KENALOG) 0.1 % Apply 1 application  topically 2 (two) times daily as needed. 30 g 1   No current facility-administered medications on file prior to visit.    Past Medical History:  Diagnosis Date   Anal fissure    Anemia    Anxiety    Asthma    Depression    History of chicken pox    History of colon polyps    Hx of sessile serrated colonic polyp 09/17/2017   12 mm ssp   Allergies  Allergen Reactions   Pseudoephedrine Other (See Comments)    Actifed caused hallucinations as a child   Latex Itching    Social History   Socioeconomic History   Marital status: Legally Separated    Spouse name: Not on file   Number of children: 2   Years of education: Not on file   Highest education level: Some college, no degree  Occupational History   Not on file  Tobacco Use   Smoking status: Former    Current packs/day: 0.00    Average packs/day: 1 pack/day for 7.0 years (7.0 ttl pk-yrs)    Types: Cigarettes    Start date: 24    Quit date: 74    Years since quitting: 29.6   Smokeless tobacco: Never   Tobacco comments:    smoked off and on  Vaping Use   Vaping status: Never Used  Substance and Sexual Activity   Alcohol use: Not Currently    Comment: occasional beer   Drug use: Never   Sexual activity: Yes    Birth control/protection: Post-menopausal    Comment: 1st intercourse 57 yo-Fewer than 5 partners  Other Topics Concern   Not on file  Social History Narrative   Not on file   Social  Determinants of Health   Financial Resource Strain: High Risk (10/10/2022)   Overall Financial Resource Strain (CARDIA)    Difficulty of Paying Living Expenses: Very hard  Food Insecurity: Food Insecurity Present (10/10/2022)   Hunger Vital Sign    Worried About Running Out of Food in the Last Year: Often true    Ran Out of Food in the Last Year: Sometimes true  Transportation Needs: Unmet Transportation Needs (10/10/2022)   PRAPARE - Transportation  Lack of Transportation (Medical): Yes    Lack of Transportation (Non-Medical): Yes  Physical Activity: Sufficiently Active (10/10/2022)   Exercise Vital Sign    Days of Exercise per Week: 5 days    Minutes of Exercise per Session: 40 min  Stress: Stress Concern Present (10/10/2022)   [employer] of Occupational Health - Occupational Stress Questionnaire    Feeling of Stress : To some extent  Social Connections: Socially Isolated (10/10/2022)   Social Connection and Isolation Panel [NHANES]    Frequency of Communication with Friends and Family: More than three times a week    Frequency of Social Gatherings with Friends and Family: More than three times a week    Attends Religious Services: Never    Database administrator or Organizations: No    Attends Banker Meetings: Not on file    Marital Status: Separated   Vitals:   10/10/22 1454  BP: 120/80  Pulse: 73  Resp: 12  Temp: 98.4 F (36.9 C)  SpO2: 99%   Body mass index is 26.02 kg/m.  Physical Exam Vitals and nursing note reviewed.  Constitutional:      General: She is not in acute distress.    Appearance: She is well-developed.  HENT:     Head: Normocephalic and atraumatic.     Right Ear: Tympanic membrane, ear canal and external ear normal.     Left Ear: External ear normal.     Ears:     Comments: Excess cerumen left ear canal, TM seen partially.    Mouth/Throat:     Mouth: Mucous membranes are moist.     Pharynx: Oropharynx is clear.  Eyes:      Conjunctiva/sclera: Conjunctivae normal.  Neck:     Thyroid: No thyroid mass.     Vascular: No carotid bruit.  Cardiovascular:     Rate and Rhythm: Normal rate and regular rhythm.     Heart sounds: No murmur heard. Pulmonary:     Effort: Pulmonary effort is normal. No respiratory distress.     Breath sounds: Normal breath sounds.  Abdominal:     Palpations: Abdomen is soft. There is no mass.     Tenderness: There is no abdominal tenderness.  Lymphadenopathy:     Cervical: No cervical adenopathy.  Skin:    General: Skin is warm.     Findings: No erythema or rash.  Neurological:     General: No focal deficit present.     Mental Status: She is alert and oriented to person, place, and time.     Cranial Nerves: No cranial nerve deficit.     Motor: No tremor or pronator drift.     Gait: Gait normal.  Psychiatric:        Mood and Affect: Affect normal. Mood is anxious.        Thought Content: Thought content does not include homicidal or suicidal ideation. Thought content does not include homicidal or suicidal plan.   ASSESSMENT AND PLAN:  Ms. Diaja was seen today for medication management.  Diagnoses and all orders for this visit:  Tinnitus of both ears Assessment & Plan: We discussed possible etiologies. Explained that most of the time is benign and no many effective therapies are available. Hx and examination do not suggest a serious process, so I do not think imaging is needed.  Wellbutrin could aggravate problem.  She is very concerned, so offered ENT referral but she prefers to hold on this for now. White noice  at night may help.   Insomnia, unspecified type Assessment & Plan: She is willing to try Trazodone again, 50-100 mg at bedtime. Continue good sleep hygiene. F/U in 5 months, before if needed.  Orders: -     traZODone HCl; Take 1-2 tablets (50-100 mg total) by mouth at bedtime.  Dispense: 60 tablet; Refill: 1  Severe episode of recurrent major depressive  disorder, without psychotic features [facility]) Assessment & Plan: She is reporting great improvement and inquires about changing to higher XL dose. She just started medication 3 weeks ago, may take 6-8 weeks to see its full effects, so for now recommend continuing same dose. We discussed some side effects of medications. Continue CBT. Instructed about warning signs. F/U in 5 months, before if needed.  Orders: -     buPROPion HCl; Take 1 tablet (75 mg total) by mouth 2 (two) times daily.  Dispense: 180 tablet; Refill: 1  Anxiety disorder, unspecified type Assessment & Plan: She does feels like is has improved since started on Wellbutrin for depression. Continue CBT weekly.   I spent a total of 40 minutes in both face to face and non face to face activities for this visit on the date of this encounter. During this time history was obtained and documented, examination was performed, and assessment/plan discussed.  Return in about 5 months (around 03/12/2023) for chronic problems.  Jaleel Allen G. [place], MD  [facility]. [place] office.

## 2022-10-10 ENCOUNTER — Ambulatory Visit: Payer: BLUE CROSS/BLUE SHIELD | Admitting: Family Medicine

## 2022-10-10 VITALS — BP 120/80 | HR 73 | Temp 98.4°F | Resp 12 | Ht 67.0 in | Wt 166.1 lb

## 2022-10-10 DIAGNOSIS — H9313 Tinnitus, bilateral: Secondary | ICD-10-CM

## 2022-10-10 DIAGNOSIS — G4700 Insomnia, unspecified: Secondary | ICD-10-CM | POA: Diagnosis not present

## 2022-10-10 DIAGNOSIS — F332 Major depressive disorder, recurrent severe without psychotic features: Secondary | ICD-10-CM | POA: Diagnosis not present

## 2022-10-10 DIAGNOSIS — F419 Anxiety disorder, unspecified: Secondary | ICD-10-CM | POA: Diagnosis not present

## 2022-10-10 MED ORDER — BUPROPION HCL 75 MG PO TABS
75.0000 mg | ORAL_TABLET | Freq: Two times a day (BID) | ORAL | 1 refills | Status: DC
Start: 2022-10-10 — End: 2023-04-23

## 2022-10-10 NOTE — Patient Instructions (Addendum)
A few things to remember from today's visit:  Tinnitus of both ears  Insomnia, unspecified type  Severe episode of recurrent major depressive disorder, without psychotic features (HCC) - Plan: buPROPion (WELLBUTRIN) 75 MG tablet  If you need refills for medications you take chronically, please call your pharmacy. Do not use My Chart to request refills or for acute issues that need immediate attention. If you send a my chart message, it may take a few days to be addressed, specially if I am not in the office.  Please be sure medication list is accurate. If a new problem present, please set up appointment sooner than planned today.

## 2022-10-10 NOTE — Assessment & Plan Note (Signed)
She does feels like is has improved since started on Wellbutrin for depression. Continue CBT weekly.

## 2022-10-12 MED ORDER — TRAZODONE HCL 50 MG PO TABS
50.0000 mg | ORAL_TABLET | Freq: Every day | ORAL | 1 refills | Status: DC
Start: 2022-10-12 — End: 2023-08-16

## 2022-10-12 NOTE — Assessment & Plan Note (Signed)
She is willing to try Trazodone again, 50-100 mg at bedtime. Continue good sleep hygiene. F/U in 5 months, before if needed.

## 2022-10-12 NOTE — Assessment & Plan Note (Signed)
She is reporting great improvement and inquires about changing to higher XL dose. She just started medication 3 weeks ago, may take 6-8 weeks to see its full effects, so for now recommend continuing same dose. We discussed some side effects of medications. Continue CBT. Instructed about warning signs. F/U in 5 months, before if needed.

## 2022-10-12 NOTE — Assessment & Plan Note (Signed)
We discussed possible etiologies. Explained that most of the time is benign and no many effective therapies are available. Hx and examination do not suggest a serious process, so I do not think imaging is needed.  Wellbutrin could aggravate problem.  She is very concerned, so offered ENT referral but she prefers to hold on this for now. White noice at night may help.

## 2022-11-12 ENCOUNTER — Ambulatory Visit
Admission: EM | Admit: 2022-11-12 | Discharge: 2022-11-12 | Disposition: A | Discharge status: Home / Self Care | Payer: BLUE CROSS/BLUE SHIELD | Attending: Internal Medicine | Admitting: Internal Medicine

## 2022-11-12 ENCOUNTER — Ambulatory Visit: Payer: BLUE CROSS/BLUE SHIELD

## 2022-11-12 DIAGNOSIS — M79672 Pain in left foot: Secondary | ICD-10-CM | POA: Diagnosis not present

## 2022-11-12 DIAGNOSIS — S92352A Displaced fracture of fifth metatarsal bone, left foot, initial encounter for closed fracture: Secondary | ICD-10-CM

## 2022-11-12 NOTE — ED Provider Notes (Addendum)
[facility]    CSN: [id number] Arrival date & time: 11/12/22  1058      History   Chief Complaint No chief complaint on file.   HPI Mary Jimenez is a 57 y.o. female presents for foot pain.  Patient reports last night she stepped on her grandsons train causing her to invert her left foot.  Reports immediate pain swelling and bruising with inability to bear weight that has persisted through today.  No numbness or tingling.  No history of injuries or surgeries to the left foot in the past.  She has been taking ibuprofen for symptoms.  Denies any other injuries or concerns at this time.  HPI  Past Medical History:  Diagnosis Date   Anal fissure    Anemia    Anxiety    Asthma    Depression    History of chicken pox    History of colon polyps    Hx of sessile serrated colonic polyp 09/17/2017   12 mm ssp    Patient Active Problem List   Diagnosis Date Noted   Tinnitus of both ears 10/10/2022   Hemangioma of liver 07/12/2022   Routine general medical examination at a health care facility 05/15/2022   B12 deficiency 05/15/2022   Severe episode of recurrent major depressive disorder, without psychotic features (HCC) 08/15/2021   Insomnia 12/19/2017   Anxiety disorder 12/19/2017   Allergic rhinitis 12/19/2017   Hot flashes due to menopause 12/19/2017   Hx of sessile serrated colonic polyp 09/17/2017    Past Surgical History:  Procedure Laterality Date   CERVICAL POLYPECTOMY     HEMANGIOMA EXCISION  05/2012   liver hemangeoma removed and partial liver   TONSILLECTOMY AND ADENOIDECTOMY      OB History     Gravida  3   Para  2   Term  2   Preterm      AB  1   Living  2      SAB      IAB  1   Ectopic      Multiple      Live Births               Home Medications    Prior to Admission medications   Medication Sig Start Date End Date Taking? Authorizing Provider  albuterol (VENTOLIN HFA) 108 (90 Base) MCG/ACT inhaler Inhale into  the lungs. 09/04/16   [provider]  buPROPion (WELLBUTRIN) 75 MG tablet Take 1 tablet (75 mg total) by mouth 2 (two) times daily. 10/10/22   [place], Betty G, MD  cetirizine (ZYRTEC) 10 MG tablet Take 10 mg by mouth daily.    [provider]  cyanocobalamin (VITAMIN B12) 500 MCG tablet Take by mouth.    [provider]  estradiol (ESTRACE) 0.1 MG/GM vaginal cream Place 1 Applicatorful vaginally 2 (two) times a week. Insert 2 grams twice weekly 06/15/22   Wyline Beady A, NP  estradiol (VIVELLE-DOT) 0.0375 MG/24HR Place 1 patch onto the skin 2 (two) times a week. 06/15/22   Olivia Mackie, NP  Multiple Vitamin (MULTIVITAMIN) capsule Take 1 capsule by mouth daily.    [provider]  progesterone (PROMETRIUM) 100 MG capsule Take 1 capsule (100 mg total) by mouth daily. 06/13/22   Olivia Mackie, NP  traZODone (DESYREL) 50 MG tablet Take 1-2 tablets (50-100 mg total) by mouth at bedtime. 10/12/22   [place], Betty G, MD  triamcinolone cream (KENALOG) 0.1 % Apply  1 application  topically 2 (two) times daily as needed. 08/15/21   [place], Betty G, MD    Family History Family History  Problem Relation Age of Onset   Arthritis Mother    Asthma Mother    Depression Mother    Hypertension Mother    Kidney failure Father    Asthma Sister    Hypertension Sister    Miscarriages / Stillbirths Sister    Alcohol abuse Maternal Grandmother    Lung cancer Maternal Grandmother    Cancer Maternal Grandfather        ? type   Uterine cancer Maternal Aunt     Social History Social History   Tobacco Use   Smoking status: Former    Current packs/day: 0.00    Average packs/day: 1 pack/day for 7.0 years (7.0 ttl pk-yrs)    Types: Cigarettes    Start date: 56    Quit date: 1995    Years since quitting: 29.7   Smokeless tobacco: Never   Tobacco comments:    smoked off and on  Vaping Use   Vaping status: Never Used  Substance Use Topics   Alcohol use: Not  Currently    Comment: occasional beer   Drug use: Never     Allergies   Pseudoephedrine and Latex   Review of Systems Review of Systems  Musculoskeletal:        Left foot pain     Physical Exam Triage Vital Signs ED Triage Vitals  Encounter Vitals Group     BP 11/12/22 1224 (!) 153/88     Systolic BP Percentile --      Diastolic BP Percentile --      Pulse Rate 11/12/22 1224 85     Resp 11/12/22 1224 16     Temp 11/12/22 1224 98.4 F (36.9 C)     Temp Source 11/12/22 1224 Oral     SpO2 11/12/22 1224 98 %     Weight --      Height --      Head Circumference --      Peak Flow --      Pain Score 11/12/22 1223 8     Pain Loc --      Pain Education --      Exclude from Growth Chart --    No data found.  Updated Vital Signs BP (!) 153/88 (BP Location: Right Arm)   Pulse 85   Temp 98.4 F (36.9 C) (Oral)   Resp 16   LMP 09/11/2019   SpO2 98%   Visual Acuity Right Eye Distance:   Left Eye Distance:   Bilateral Distance:    Right Eye Near:   Left Eye Near:    Bilateral Near:     Physical Exam Vitals and nursing note reviewed.  Constitutional:      General: She is not in acute distress.    Appearance: Normal appearance. She is not ill-appearing.  HENT:     Head: Normocephalic and atraumatic.  Eyes:     Pupils: Pupils are equal, round, and reactive to light.  Cardiovascular:     Rate and Rhythm: Normal rate.  Pulmonary:     Effort: Pulmonary effort is normal.  Musculoskeletal:       Feet:  Feet:     Comments: There is moderate swelling with ecchymosis and moderate tenderness palpation to the lateral left foot that extends from the mid fifth metatarsal to distal foot.  No tenderness to medial foot, ankle.  DP +2.  Pain with flexion or extension of the foot. Skin:    General: Skin is warm and dry.  Neurological:     General: No focal deficit present.     Mental Status: She is alert and oriented to person, place, and time.  Psychiatric:        Mood  and Affect: Mood normal.        Behavior: Behavior normal.      UC Treatments / Results  Labs (all labs ordered are listed, but only abnormal results are displayed) Labs Reviewed - No data to display  EKG   Radiology DG Foot Complete Left  Result Date: 11/12/2022 CLINICAL DATA:  Foot injury. EXAM: LEFT FOOT - COMPLETE 3 VIEW COMPARISON:  None Available. FINDINGS: Oblique fracture in the fifth metatarsal is displaced with of the cortex. Additional fractures are present. No radiopaque foreign body is present. IMPRESSION: Oblique fracture in the fifth metatarsal with of the cortex. Electronically Signed   By: Marin Roberts M.D.   On: 11/12/2022 12:45    Procedures Procedures (including critical care time)  Medications Ordered in UC Medications - No data to display  Initial Impression / Assessment and Plan / UC Course  I have reviewed the triage vital signs and the nursing notes.  Pertinent labs & imaging results that were available during my care of the patient were reviewed by me and considered in my medical decision making (see chart for details).     Reviewed exam and symptoms with patient.  X-ray positive for fifth metatarsal fracture that is displaced.  Patient placed in boot and fitted with crutches.  Advised no weightbearing until she sees orthopedics.  Patient referred to orthopedics will contact tomorrow to arrange appointment.  Patient states her pain is controlled with over-the-counter analgesics and declines Rx Norco.  Discussed RICE therapy as well.  ER precautions reviewed and patient verbalized understanding.  PCP follow-up 1 week. Final Clinical Impressions(s) / UC Diagnoses   Final diagnoses:  Closed displaced fracture of fifth metatarsal bone of left foot, initial encounter     Discharge Instructions      Keep foot in the boot and no weight bearing until you see orthopedics.  Continue to elevate, ice and take over-the-counter ibuprofen or Tylenol as  needed.  Please contact orthopedics to make a follow-up appointment within the next 1 to 2 days.  Also follow-up with your PCP in 1 week for recheck.  Please go to the emergency room if you develop any worsening symptoms that occur prior to seeing your orthopedic.  This includes but is not limited to uncontrolled pain or swelling, persistent numbness or tingling, or any new concerns that arise.  I hope you feel better soon!     ED Prescriptions   None    PDMP not reviewed this encounter.   Radford Pax, NP 11/12/22 1309    Radford Pax, NP 11/12/22 1316

## 2022-11-12 NOTE — ED Triage Notes (Signed)
Pt presents to UC w/ c/o left foot injury since yesterday. Pt states she stepped on her grandson's train and fell. Left foot swelling and bruising present.

## 2022-11-12 NOTE — Discharge Instructions (Addendum)
Keep foot in the boot and no weight bearing until you see orthopedics.  Continue to elevate, ice and take over-the-counter ibuprofen or Tylenol as needed.  Please contact orthopedics to make a follow-up appointment within the next 1 to 2 days.  Also follow-up with your PCP in 1 week for recheck.  Please go to the emergency room if you develop any worsening symptoms that occur prior to seeing your orthopedic.  This includes but is not limited to uncontrolled pain or swelling, persistent numbness or tingling, or any new concerns that arise.  I hope you feel better soon!

## 2022-11-14 ENCOUNTER — Encounter: Payer: Self-pay | Admitting: Physician Assistant

## 2022-11-14 ENCOUNTER — Ambulatory Visit: Payer: BLUE CROSS/BLUE SHIELD | Admitting: Physician Assistant

## 2022-11-14 DIAGNOSIS — S92352A Displaced fracture of fifth metatarsal bone, left foot, initial encounter for closed fracture: Secondary | ICD-10-CM | POA: Diagnosis not present

## 2022-11-14 NOTE — Progress Notes (Signed)
Office Visit Note   Patient: Mary Jimenez           Date of Birth: May 03, 1965           MRN: [id number] Visit Date: 11/14/2022              Requested by: [place], Betty G, MD [address] [place],  [place] [address] PCP: [place], Betty G, MD   Assessment & Plan: Visit Diagnoses:  1. Closed displaced fracture of fifth metatarsal bone of left foot, initial encounter     Plan: Patient is a pleasant 57 year old woman who is 2 days status post twisting her foot on her grandchild's toy.  She had immediate pain over the lateral side of her foot.  She was seen in urgent care.  Diagnosed with a metatarsal shaft fracture of the fifth metatarsal left foot.  She is currently in a cam walker boot.  Has had a fracture of her right foot a while back.  Denies any other injuries in this foot.  No history of blood clots she is otherwise healthy.  Describes her pain is mild to moderate just when she touches her foot.  She will be limiting her weightbearing for the next couple weeks.  She feels uncomfortable at night.  I will make her a posterior splint that she can change into and wear at night instead of the boot.  In 1 week she will follow-up x-ray should be taken she is on hormone replacement therapy.  I suggested because of immobilization that she take 1 baby aspirin a day  Follow-Up Instructions: Return in about 1 week (around 11/21/2022).   Orders:  No orders of the defined types were placed in this encounter.  No orders of the defined types were placed in this encounter.     Procedures: No procedures performed   Clinical Data: No additional findings.   Subjective: No chief complaint on file.   HPI pleasant 57 year old woman who is 2 days status post rolling her foot on a child toy no recent history of injury otherwise.  Had significant ecchymosis and swelling.  Was seen in an urgent care.  Rates the pain as mild to moderate.  X-rays she had done demonstrated a displaced fifth  metatarsal shaft fracture  Review of Systems  All other systems reviewed and are negative.    Objective: Vital Signs: LMP 09/11/2019   Physical Exam Constitutional:      Appearance: Normal appearance.  Pulmonary:     Effort: Pulmonary effort is normal.     Breath sounds: Normal breath sounds.  Skin:    General: Skin is warm and dry.  Neurological:     General: No focal deficit present.     Mental Status: She is alert and oriented to person, place, and time.  Psychiatric:        Mood and Affect: Mood normal.        Behavior: Behavior normal.     Ortho Exam Examination of her foot she has a palpable pulse compartments are soft and nontender she is neurovascularly intact she has no tenderness through the Lisfranc complex she has ecchymosis and swelling over the lateral foot with tenderness to palpation.  Nontender over the lateral ligaments Specialty Comments:  No specialty comments available.  Imaging: No results found.   PMFS History: Patient Active Problem List   Diagnosis Date Noted   Closed displaced fracture of fifth metatarsal bone of left foot 11/14/2022   Tinnitus of both ears  10/10/2022   Hemangioma of liver 07/12/2022   Routine general medical examination at a health care facility 05/15/2022   B12 deficiency 05/15/2022   Severe episode of recurrent major depressive disorder, without psychotic features (HCC) 08/15/2021   Insomnia 12/19/2017   Anxiety disorder 12/19/2017   Allergic rhinitis 12/19/2017   Hot flashes due to menopause 12/19/2017   Hx of sessile serrated colonic polyp 09/17/2017   Past Medical History:  Diagnosis Date   Anal fissure    Anemia    Anxiety    Asthma    Depression    History of chicken pox    History of colon polyps    Hx of sessile serrated colonic polyp 09/17/2017   12 mm ssp    Family History  Problem Relation Age of Onset   Arthritis Mother    Asthma Mother    Depression Mother    Hypertension Mother    Kidney  failure Father    Asthma Sister    Hypertension Sister    Miscarriages / [place] Sister    Alcohol abuse Maternal Grandmother    Lung cancer Maternal Grandmother    Cancer Maternal Grandfather        ? type   Uterine cancer Maternal Aunt     Past Surgical History:  Procedure Laterality Date   CERVICAL POLYPECTOMY     HEMANGIOMA EXCISION  05/2012   liver hemangeoma removed and partial liver   TONSILLECTOMY AND ADENOIDECTOMY     Social History   Occupational History   Not on file  Tobacco Use   Smoking status: Former    Current packs/day: 0.00    Average packs/day: 1 pack/day for 7.0 years (7.0 ttl pk-yrs)    Types: Cigarettes    Start date: 29    Quit date: 1995    Years since quitting: 29.7   Smokeless tobacco: Never   Tobacco comments:    smoked off and on  Vaping Use   Vaping status: Never Used  Substance and Sexual Activity   Alcohol use: Not Currently    Comment: occasional beer   Drug use: Never   Sexual activity: Yes    Birth control/protection: Post-menopausal    Comment: 1st intercourse 57 yo-Fewer than 5 partners

## 2022-11-20 ENCOUNTER — Ambulatory Visit: Payer: BLUE CROSS/BLUE SHIELD | Admitting: Physician Assistant

## 2022-11-20 ENCOUNTER — Encounter: Payer: Self-pay | Admitting: Physician Assistant

## 2022-11-20 ENCOUNTER — Ambulatory Visit (INDEPENDENT_AMBULATORY_CARE_PROVIDER_SITE_OTHER): Payer: BLUE CROSS/BLUE SHIELD

## 2022-11-20 DIAGNOSIS — S92352A Displaced fracture of fifth metatarsal bone, left foot, initial encounter for closed fracture: Secondary | ICD-10-CM

## 2022-11-20 NOTE — Progress Notes (Signed)
Office Visit Note   Patient: Mary Jimenez           Date of Birth: [birth year]           MRN: [id number] Visit Date: 11/20/2022              Requested by: [place], Betty G, MD [address] [place],  [place] [address] PCP: [place], Betty G, MD  Chief Complaint  Patient presents with   Left Foot - Follow-up    Fifth metatarsal fracture      HPI: [name] is now little over a week status post left fifth metatarsal shaft fracture.  She has been in a tall boot which was given to her at urgent care and nonweightbearing.  She has been using a posterior splint at night.  Her concern is that her toes seem to get a lot of spasm in her fourth toe is elevated.  She says it has been like that since the injury..  Rates her pain as mild  Assessment & Plan: Visit Diagnoses:  1. Closed displaced fracture of fifth metatarsal bone of left foot, initial encounter     Plan: I do not think anything concerning in her exam today I think some of her issues are with spasming as well as swelling.  She is neurovascularly intact her foot is warm her sensation is intact.  Would like for her to have another 2 weeks of nonweightbearing and follow-up with me then.  Also talked to her about possibly getting a postoperative shoe.  She would also like to obtain a knee scooter if given her prescription for both have encouraged her to come out of the boot to do range of motion at the very least of her ankle.  X-rays at her next visit  Follow-Up Instructions: No follow-ups on file.   Ortho Exam  Patient is alert, oriented, no adenopathy, well-dressed, normal affect, normal respiratory effort. Examination of her left foot she is neurovascularly intact she does have resolving ecchymosis on the lateral aspect of her foot slight elevation of the fourth toe but no obvious deformity compartments are soft and compressible she is neurovascularly intact no tenderness through the midfoot no tenderness in the  toes  Imaging: No results found. No images are attached to the encounter.  Labs: Lab Results  Component Value Date   HGBA1C 5.3 05/15/2022   HGBA1C 5.1 01/21/2019     Lab Results  Component Value Date   ALBUMIN 4.7 01/21/2019    No results found for: "MG" Lab Results  Component Value Date   VD25OH 64.75 01/21/2019    No results found for: "PREALBUMIN"    Latest Ref Rng & Units 05/12/2021   12:46 PM 09/12/2019    3:37 PM 01/21/2019   12:17 PM  CBC EXTENDED  WBC 3.8 - 10.8 Thousand/uL 4.3  5.8  4.4   RBC 3.80 - 5.10 Million/uL 4.29  4.33  4.52   Hemoglobin 11.7 - 15.5 g/dL 84.6  96.2  95.2   HCT 35.0 - 45.0 % 38.9  39.9  41.0   Platelets 140 - 400 Thousand/uL 215  211  194.0   NEUT# 1,500 - 7,800 cells/uL 2,752     Lymph# 850 - 3,900 cells/uL 1,066        There is no height or weight on file to calculate BMI.  Orders:  Orders Placed This Encounter  Procedures   XR Foot Complete Left   No orders of the defined types were  placed in this encounter.    Procedures: No procedures performed  Clinical Data: No additional findings.  ROS:  All other systems negative, except as noted in the HPI. Review of Systems  Objective: Vital Signs: LMP 09/11/2019   Specialty Comments:  No specialty comments available.  PMFS History: Patient Active Problem List   Diagnosis Date Noted   Closed displaced fracture of fifth metatarsal bone of left foot 11/14/2022   Tinnitus of both ears 10/10/2022   Hemangioma of liver 07/12/2022   Routine general medical examination at a health care facility 05/15/2022   B12 deficiency 05/15/2022   Severe episode of recurrent major depressive disorder, without psychotic features (HCC) 08/15/2021   Insomnia 12/19/2017   Anxiety disorder 12/19/2017   Allergic rhinitis 12/19/2017   Hot flashes due to menopause 12/19/2017   Hx of sessile serrated colonic polyp 09/17/2017   Past Medical History:  Diagnosis Date   Anal fissure     Anemia    Anxiety    Asthma    Depression    History of chicken pox    History of colon polyps    Hx of sessile serrated colonic polyp 09/17/2017   12 mm ssp    Family History  Problem Relation Age of Onset   Arthritis Mother    Asthma Mother    Depression Mother    Hypertension Mother    Kidney failure Father    Asthma Sister    Hypertension Sister    Miscarriages / [place] Sister    Alcohol abuse Maternal Grandmother    Lung cancer Maternal Grandmother    Cancer Maternal Grandfather        ? type   Uterine cancer Maternal Aunt     Past Surgical History:  Procedure Laterality Date   CERVICAL POLYPECTOMY     HEMANGIOMA EXCISION  05/2012   liver hemangeoma removed and partial liver   TONSILLECTOMY AND ADENOIDECTOMY     Social History   Occupational History   Not on file  Tobacco Use   Smoking status: Former    Current packs/day: 0.00    Average packs/day: 1 pack/day for 7.0 years (7.0 ttl pk-yrs)    Types: Cigarettes    Start date: 50    Quit date: 1995    Years since quitting: 29.7   Smokeless tobacco: Never   Tobacco comments:    smoked off and on  Vaping Use   Vaping status: Never Used  Substance and Sexual Activity   Alcohol use: Not Currently    Comment: occasional beer   Drug use: Never   Sexual activity: Yes    Birth control/protection: Post-menopausal    Comment: 1st intercourse 57 yo-Fewer than 5 partners

## 2022-11-21 ENCOUNTER — Telehealth: Payer: Self-pay | Admitting: Physician Assistant

## 2022-11-21 NOTE — Telephone Encounter (Signed)
Patient called advised she accidentally lost her balance and stepped down on her left foot and it was very painful. Patient said her foot is not painful now. Patient asked if she should come back in to see [place]? The number to contact patient is [phone]

## 2022-12-04 ENCOUNTER — Ambulatory Visit (INDEPENDENT_AMBULATORY_CARE_PROVIDER_SITE_OTHER): Payer: BLUE CROSS/BLUE SHIELD | Admitting: Physician Assistant

## 2022-12-04 ENCOUNTER — Ambulatory Visit (INDEPENDENT_AMBULATORY_CARE_PROVIDER_SITE_OTHER): Payer: BLUE CROSS/BLUE SHIELD

## 2022-12-04 ENCOUNTER — Encounter: Payer: Self-pay | Admitting: Physician Assistant

## 2022-12-04 DIAGNOSIS — S92352A Displaced fracture of fifth metatarsal bone, left foot, initial encounter for closed fracture: Secondary | ICD-10-CM

## 2022-12-04 NOTE — Progress Notes (Signed)
Office Visit Note   Patient: Mary Jimenez           Date of Birth: 20-Dec-1965           MRN: [id number] Visit Date: 12/04/2022              Requested by: [place], Betty G, MD [address] [place],  [place] [address] PCP: [place], Betty G, MD  Chief Complaint  Patient presents with   Left Foot - Follow-up, Fracture      HPI: Mary Jimenez is a pleasant 57 year old woman who is now 3 weeks status post left fifth metatarsal shaft fracture.  She has been in a postop shoe and nonweightbearing.  She has been taking 2000 international units of vitamin D daily.  She is a vegan and tries to get as much calcium in her diet as is possible.  Overall she has been good about maintaining nonweightbearing restrictions  Assessment & Plan: Visit Diagnoses:  1. Closed displaced fracture of fifth metatarsal bone of left foot, initial encounter     Plan: Reviewed x-rays with her today.  She has good alignment but is lacking evidence of good bony bridging.  We talked about this.  Should remain nonweightbearing for another 2 weeks.  Encouraged to her to increase her vitamin D to 5000 international units daily.  Also discussed calcium supplementation 1200 to 1500 mg a day.  Half of this could be diet half could be supplement.  She will follow-up in 2 weeks at which time x-ray should be taken  Follow-Up Instructions: No follow-ups on file.   Ortho Exam  Patient is alert, oriented, no adenopathy, well-dressed, normal affect, normal respiratory effort. Examination of her left foot she has resolving ecchymosis she is neurovascular intact she has strong dorsalis pedis pulse she is still tender over the fifth metatarsal shaft compartments are soft and compressible well-maintained alignment clinically  Imaging: XR Foot Complete Left  Result Date: 12/04/2022 Radiographs left foot were taken today fracture remains reduced in good position.  Little evidence of bony bridging or callus.  X-rays were reviewed  with Dr. Lajoyce Corners  No images are attached to the encounter.  Labs: Lab Results  Component Value Date   HGBA1C 5.3 05/15/2022   HGBA1C 5.1 01/21/2019     Lab Results  Component Value Date   ALBUMIN 4.7 01/21/2019    No results found for: "MG" Lab Results  Component Value Date   VD25OH 64.75 01/21/2019    No results found for: "PREALBUMIN"    Latest Ref Rng & Units 05/12/2021   12:46 PM 09/12/2019    3:37 PM 01/21/2019   12:17 PM  CBC EXTENDED  WBC 3.8 - 10.8 Thousand/uL 4.3  5.8  4.4   RBC 3.80 - 5.10 Million/uL 4.29  4.33  4.52   Hemoglobin 11.7 - 15.5 g/dL 78.2  95.6  21.3   HCT 35.0 - 45.0 % 38.9  39.9  41.0   Platelets 140 - 400 Thousand/uL 215  211  194.0   NEUT# 1,500 - 7,800 cells/uL 2,752     Lymph# 850 - 3,900 cells/uL 1,066        There is no height or weight on file to calculate BMI.  Orders:  Orders Placed This Encounter  Procedures   XR Foot Complete Left   No orders of the defined types were placed in this encounter.    Procedures: No procedures performed  Clinical Data: No additional findings.  ROS:  All other systems  negative, except as noted in the HPI. Review of Systems  Objective: Vital Signs: LMP 09/11/2019   Specialty Comments:  No specialty comments available.  PMFS History: Patient Active Problem List   Diagnosis Date Noted   Closed displaced fracture of fifth metatarsal bone of left foot 11/14/2022   Tinnitus of both ears 10/10/2022   Hemangioma of liver 07/12/2022   Routine general medical examination at a health care facility 05/15/2022   B12 deficiency 05/15/2022   Severe episode of recurrent major depressive disorder, without psychotic features (HCC) 08/15/2021   Insomnia 12/19/2017   Anxiety disorder 12/19/2017   Allergic rhinitis 12/19/2017   Hot flashes due to menopause 12/19/2017   Hx of sessile serrated colonic polyp 09/17/2017   Past Medical History:  Diagnosis Date   Anal fissure    Anemia    Anxiety     Asthma    Depression    History of chicken pox    History of colon polyps    Hx of sessile serrated colonic polyp 09/17/2017   12 mm ssp    Family History  Problem Relation Age of Onset   Arthritis Mother    Asthma Mother    Depression Mother    Hypertension Mother    Kidney failure Father    Asthma Sister    Hypertension Sister    Miscarriages / [place] Sister    Alcohol abuse Maternal Grandmother    Lung cancer Maternal Grandmother    Cancer Maternal Grandfather        ? type   Uterine cancer Maternal Aunt     Past Surgical History:  Procedure Laterality Date   CERVICAL POLYPECTOMY     HEMANGIOMA EXCISION  05/2012   liver hemangeoma removed and partial liver   TONSILLECTOMY AND ADENOIDECTOMY     Social History   Occupational History   Not on file  Tobacco Use   Smoking status: Former    Current packs/day: 0.00    Average packs/day: 1 pack/day for 7.0 years (7.0 ttl pk-yrs)    Types: Cigarettes    Start date: 89    Quit date: 1995    Years since quitting: 29.7   Smokeless tobacco: Never   Tobacco comments:    smoked off and on  Vaping Use   Vaping status: Never Used  Substance and Sexual Activity   Alcohol use: Not Currently    Comment: occasional beer   Drug use: Never   Sexual activity: Yes    Birth control/protection: Post-menopausal    Comment: 1st intercourse 57 yo-Fewer than 5 partners

## 2022-12-07 ENCOUNTER — Telehealth: Payer: Self-pay

## 2022-12-07 NOTE — Telephone Encounter (Signed)
Patient called triage line stating that she would really like to talk to you. Said she is losing muscle tone since she is non weightbearing and she is so worried about it

## 2022-12-08 ENCOUNTER — Telehealth: Payer: Self-pay

## 2022-12-08 NOTE — Telephone Encounter (Signed)
Pt informed

## 2022-12-08 NOTE — Telephone Encounter (Signed)
Patient would like to know if she were to to get a Covid or Flu shot, would they interfere with her healing of her left foot.  Cb# [phone].  Please advise.  Thank you.

## 2022-12-18 ENCOUNTER — Other Ambulatory Visit: Payer: Self-pay

## 2022-12-18 ENCOUNTER — Encounter: Payer: Self-pay | Admitting: Physician Assistant

## 2022-12-18 ENCOUNTER — Ambulatory Visit (INDEPENDENT_AMBULATORY_CARE_PROVIDER_SITE_OTHER): Payer: BLUE CROSS/BLUE SHIELD | Admitting: Physician Assistant

## 2022-12-18 DIAGNOSIS — S92352A Displaced fracture of fifth metatarsal bone, left foot, initial encounter for closed fracture: Secondary | ICD-10-CM

## 2022-12-18 NOTE — Progress Notes (Signed)
Office Visit Note   Patient: Mary Jimenez           Date of Birth: 09-21-1965           MRN: [id number] Visit Date: 12/18/2022              Requested by: [place], Betty G, MD [address] [place],  [place] [address] PCP: [place], Betty G, MD  Chief Complaint  Patient presents with   Left Ankle - Follow-up, Fracture    Follow up left foot fracture      HPI: Mary Jimenez comes in today she is now 5 weeks status post left fifth metatarsal shaft fracture.  She feels about the same.  She has been trying to augment her diet with calcium and vitamin D as she is a vegan.  She is concerned that it still has quite a bit of swelling and bruising  Assessment & Plan: Visit Diagnoses:  1. Closed displaced fracture of fifth metatarsal bone of left foot, initial encounter     Plan: Patient was also seen by Dr. August Saucer.  Explained to her that manipulative wise she cannot appreciate moving the fracture so she is probably healing just fine just a little delayed healing.  She will follow-up in 3 weeks with new x-rays of her foot should still remain limited to no weightbearing is much as possible.  If in 3 weeks she had not had robust callus could consider CAT scan  Follow-Up Instructions: No follow-ups on file.   Ortho Exam  Patient is alert, oriented, no adenopathy, well-dressed, normal affect, normal respiratory effort. Examination of her left foot she does have some bruising over the lateral side minimal soft tissue swelling she is neurovascularly intact she is some tenderness to deep palpation over the fracture but no pain with manipulation of the fracture  Imaging: No results found. No images are attached to the encounter.  Labs: Lab Results  Component Value Date   HGBA1C 5.3 05/15/2022   HGBA1C 5.1 01/21/2019     Lab Results  Component Value Date   ALBUMIN 4.7 01/21/2019    No results found for: "MG" Lab Results  Component Value Date   VD25OH 64.75 01/21/2019    No  results found for: "PREALBUMIN"    Latest Ref Rng & Units 05/12/2021   12:46 PM 09/12/2019    3:37 PM 01/21/2019   12:17 PM  CBC EXTENDED  WBC 3.8 - 10.8 Thousand/uL 4.3  5.8  4.4   RBC 3.80 - 5.10 Million/uL 4.29  4.33  4.52   Hemoglobin 11.7 - 15.5 g/dL 19.1  47.8  29.5   HCT 35.0 - 45.0 % 38.9  39.9  41.0   Platelets 140 - 400 Thousand/uL 215  211  194.0   NEUT# 1,500 - 7,800 cells/uL 2,752     Lymph# 850 - 3,900 cells/uL 1,066        There is no height or weight on file to calculate BMI.  Orders:  Orders Placed This Encounter  Procedures   XR Foot Complete Left   No orders of the defined types were placed in this encounter.    Procedures: No procedures performed  Clinical Data: No additional findings.  ROS:  All other systems negative, except as noted in the HPI. Review of Systems  Objective: Vital Signs: LMP 09/11/2019   Specialty Comments:  No specialty comments available.  PMFS History: Patient Active Problem List   Diagnosis Date Noted   Closed displaced fracture of  fifth metatarsal bone of left foot 11/14/2022   Tinnitus of both ears 10/10/2022   Hemangioma of liver 07/12/2022   Routine general medical examination at a health care facility 05/15/2022   B12 deficiency 05/15/2022   Severe episode of recurrent major depressive disorder, without psychotic features (HCC) 08/15/2021   Insomnia 12/19/2017   Anxiety disorder 12/19/2017   Allergic rhinitis 12/19/2017   Hot flashes due to menopause 12/19/2017   Hx of sessile serrated colonic polyp 09/17/2017   Past Medical History:  Diagnosis Date   Anal fissure    Anemia    Anxiety    Asthma    Depression    History of chicken pox    History of colon polyps    Hx of sessile serrated colonic polyp 09/17/2017   12 mm ssp    Family History  Problem Relation Age of Onset   Arthritis Mother    Asthma Mother    Depression Mother    Hypertension Mother    Kidney failure Father    Asthma Sister     Hypertension Sister    Miscarriages / [place] Sister    Alcohol abuse Maternal Grandmother    Lung cancer Maternal Grandmother    Cancer Maternal Grandfather        ? type   Uterine cancer Maternal Aunt     Past Surgical History:  Procedure Laterality Date   CERVICAL POLYPECTOMY     HEMANGIOMA EXCISION  05/2012   liver hemangeoma removed and partial liver   TONSILLECTOMY AND ADENOIDECTOMY     Social History   Occupational History   Not on file  Tobacco Use   Smoking status: Former    Current packs/day: 0.00    Average packs/day: 1 pack/day for 7.0 years (7.0 ttl pk-yrs)    Types: Cigarettes    Start date: 63    Quit date: 1995    Years since quitting: 29.8   Smokeless tobacco: Never   Tobacco comments:    smoked off and on  Vaping Use   Vaping status: Never Used  Substance and Sexual Activity   Alcohol use: Not Currently    Comment: occasional beer   Drug use: Never   Sexual activity: Yes    Birth control/protection: Post-menopausal    Comment: 1st intercourse 57 yo-Fewer than 5 partners

## 2022-12-22 ENCOUNTER — Ambulatory Visit (INDEPENDENT_AMBULATORY_CARE_PROVIDER_SITE_OTHER): Payer: BLUE CROSS/BLUE SHIELD | Admitting: Family Medicine

## 2022-12-22 ENCOUNTER — Encounter: Payer: Self-pay | Admitting: Family Medicine

## 2022-12-22 VITALS — BP 122/80 | HR 85 | Resp 12 | Ht 67.0 in | Wt 168.1 lb

## 2022-12-22 DIAGNOSIS — R350 Frequency of micturition: Secondary | ICD-10-CM | POA: Diagnosis not present

## 2022-12-22 DIAGNOSIS — N3281 Overactive bladder: Secondary | ICD-10-CM | POA: Diagnosis not present

## 2022-12-22 DIAGNOSIS — S92352G Displaced fracture of fifth metatarsal bone, left foot, subsequent encounter for fracture with delayed healing: Secondary | ICD-10-CM

## 2022-12-22 LAB — URINALYSIS
Bilirubin Urine: NEGATIVE
Hgb urine dipstick: NEGATIVE
Ketones, ur: NEGATIVE
Leukocytes,Ua: NEGATIVE
Nitrite: NEGATIVE
Specific Gravity, Urine: 1.02 (ref 1.000–1.030)
Total Protein, Urine: NEGATIVE
Urine Glucose: NEGATIVE
Urobilinogen, UA: 0.2 (ref 0.0–1.0)
pH: 7.5 (ref 5.0–8.0)

## 2022-12-22 MED ORDER — MIRABEGRON ER 25 MG PO TB24: 25.0000 mg | ORAL_TABLET | Freq: Every day | ORAL | 1 refills | Status: DC

## 2022-12-22 NOTE — Progress Notes (Signed)
ACUTE VISIT Chief Complaint  Patient presents with   increased urination    Happens at nighttime, disrupting sleep.    HPI: [name] is a 57 y.o. female with a PMHx significant for closed displaced fracture of fifth metatarsal bone of left foot, anxiety/depression, B12 deficiency, and insomnia, among others who is here today complaining of night time polyuria and slow healing of her fractured foot.   HPI  Polyuria:  Patient complains that since she broke her foot, she has to urinate significantly more, especially at night.  She denies burning sensation, an increase in caffeine intake, urinary incontinence, pelvic pain, or vaginal bleeding or discharge.  She mentions she had stopped taking her B vitamin complex, which seemed to help her symptoms.  She says her father died suddenly of chronic kidney disease.   Foot: She mentions she fractured the fifth metatarsal bone of her left foot after a fall.  She says multiple x-rays showed her bones were not healing properly, so she has increased her vitamin D and calcium intake.   Review of Systems See other pertinent positives and negatives in HPI.  Current Outpatient Medications on File Prior to Visit  Medication Sig Dispense Refill   albuterol (VENTOLIN HFA) 108 (90 Base) MCG/ACT inhaler Inhale into the lungs.     buPROPion (WELLBUTRIN) 75 MG tablet Take 1 tablet (75 mg total) by mouth 2 (two) times daily. 180 tablet 1   cetirizine (ZYRTEC) 10 MG tablet Take 10 mg by mouth daily.     cyanocobalamin (VITAMIN B12) 500 MCG tablet Take by mouth.     estradiol (ESTRACE) 0.1 MG/GM vaginal cream Place 1 Applicatorful vaginally 2 (two) times a week. Insert 2 grams twice weekly 42.5 g 2   estradiol (VIVELLE-DOT) 0.0375 MG/24HR Place 1 patch onto the skin 2 (two) times a week. 24 patch 3   Multiple Vitamin (MULTIVITAMIN) capsule Take 1 capsule by mouth daily.     progesterone (PROMETRIUM) 100 MG capsule Take 1 capsule (100 mg total)  by mouth daily. 90 capsule 3   traZODone (DESYREL) 50 MG tablet Take 1-2 tablets (50-100 mg total) by mouth at bedtime. 60 tablet 1   triamcinolone cream (KENALOG) 0.1 % Apply 1 application  topically 2 (two) times daily as needed. 30 g 1   No current facility-administered medications on file prior to visit.    Past Medical History:  Diagnosis Date   Anal fissure    Anemia    Anxiety    Asthma    Depression    History of chicken pox    History of colon polyps    Hx of sessile serrated colonic polyp 09/17/2017   12 mm ssp   Allergies  Allergen Reactions   Pseudoephedrine Other (See Comments)    Actifed caused hallucinations as a child   Latex Itching    Social History   Socioeconomic History   Marital status: Legally Separated    Spouse name: Not on file   Number of children: 2   Years of education: Not on file   Highest education level: Some college, no degree  Occupational History   Not on file  Tobacco Use   Smoking status: Former    Current packs/day: 0.00    Average packs/day: 1 pack/day for 7.0 years (7.0 ttl pk-yrs)    Types: Cigarettes    Start date: 53    Quit date: 1995    Years since quitting: 29.8   Smokeless tobacco: Never  Tobacco comments:    smoked off and on  Vaping Use   Vaping status: Never Used  Substance and Sexual Activity   Alcohol use: Not Currently    Comment: occasional beer   Drug use: Never   Sexual activity: Yes    Birth control/protection: Post-menopausal    Comment: 1st intercourse 57 yo-Fewer than 5 partners  Other Topics Concern   Not on file  Social History Narrative   Not on file   Social Determinants of Health   Financial Resource Strain: High Risk (10/10/2022)   Overall Financial Resource Strain (CARDIA)    Difficulty of Paying Living Expenses: Very hard  Food Insecurity: Food Insecurity Present (10/10/2022)   Hunger Vital Sign    Worried About Running Out of Food in the Last Year: Often true    Ran Out of Food in  the Last Year: Sometimes true  Transportation Needs: Unmet Transportation Needs (10/10/2022)   PRAPARE - Transportation    Lack of Transportation (Medical): Yes    Lack of Transportation (Non-Medical): Yes  Physical Activity: Sufficiently Active (10/10/2022)   Exercise Vital Sign    Days of Exercise per Week: 5 days    Minutes of Exercise per Session: 40 min  Stress: Stress Concern Present (10/10/2022)   [employer] of Occupational Health - Occupational Stress Questionnaire    Feeling of Stress : To some extent  Social Connections: Socially Isolated (10/10/2022)   Social Connection and Isolation Panel [NHANES]    Frequency of Communication with Friends and Family: More than three times a week    Frequency of Social Gatherings with Friends and Family: More than three times a week    Attends Religious Services: Never    Database administrator or Organizations: No    Attends Banker Meetings: Not on file    Marital Status: Separated    Vitals:   12/22/22 1348  BP: 122/80  Pulse: 85  SpO2: 99%   Body mass index is 26.33 kg/m.  Physical Exam Vitals and nursing note reviewed.  Constitutional:      General: She is not in acute distress.    Appearance: She is well-developed.  HENT:     Head: Normocephalic and atraumatic.     Mouth/Throat:     Mouth: Mucous membranes are moist.     Pharynx: Oropharynx is clear.  Eyes:     Conjunctiva/sclera: Conjunctivae normal.  Cardiovascular:     Rate and Rhythm: Normal rate and regular rhythm.     Heart sounds: No murmur heard. Pulmonary:     Effort: Pulmonary effort is normal. No respiratory distress.     Breath sounds: Normal breath sounds.  Abdominal:     Palpations: Abdomen is soft. There is no hepatomegaly or mass.     Tenderness: There is no abdominal tenderness.  Musculoskeletal:     Comments: Surgical shoe left foot.  Lymphadenopathy:     Cervical: No cervical adenopathy.  Skin:    General: Skin is warm.      Findings: No erythema or rash.  Neurological:     General: No focal deficit present.     Mental Status: She is alert and oriented to person, place, and time.     Cranial Nerves: No cranial nerve deficit.  Psychiatric:     Comments: Well groomed, good eye contact.     ASSESSMENT AND PLAN:  [name] was seen today for nighttime polyuria and slow healing of her broken foot.   There  are no diagnoses linked to this encounter.  No follow-ups on file.  I, Suanne Marker, acting as a scribe for Onica Davidovich [place], MD., have documented all relevant documentation on the behalf of Rosario Kushner [place], MD, as directed by  Aldred Mase [place], MD while in the presence of Oakleigh Hesketh [place], MD.   I, Suanne Marker, have reviewed all documentation for this visit. The documentation on 12/22/22 for the exam, diagnosis, procedures, and orders are all accurate and complete.  Little Bashore G. [place], MD  [facility]. [place] office.  Discharge Instructions   None

## 2022-12-22 NOTE — Patient Instructions (Addendum)
A few things to remember from today's visit:  Urinary frequency - Plan: mirabegron ER (MYRBETRIQ) 25 MG TB24 tablet  Overactive bladder - Plan: mirabegron ER (MYRBETRIQ) 25 MG TB24 tablet  Myrbetriq los dose started today. We could increase dose of you feel like it helps.  If you need refills for medications you take chronically, please call your pharmacy. Do not use My Chart to request refills or for acute issues that need immediate attention. If you send a my chart message, it may take a few days to be addressed, specially if I am not in the office.  Please be sure medication list is accurate. If a new problem present, please set up appointment sooner than planned today.

## 2022-12-24 ENCOUNTER — Encounter: Payer: Self-pay | Admitting: Family Medicine

## 2023-01-03 ENCOUNTER — Ambulatory Visit: Payer: BLUE CROSS/BLUE SHIELD | Admitting: Nurse Practitioner

## 2023-01-03 ENCOUNTER — Encounter: Payer: Self-pay | Admitting: Nurse Practitioner

## 2023-01-03 VITALS — BP 112/80 | HR 86

## 2023-01-03 DIAGNOSIS — R350 Frequency of micturition: Secondary | ICD-10-CM | POA: Diagnosis not present

## 2023-01-03 DIAGNOSIS — N8189 Other female genital prolapse: Secondary | ICD-10-CM

## 2023-01-03 DIAGNOSIS — N898 Other specified noninflammatory disorders of vagina: Secondary | ICD-10-CM | POA: Diagnosis not present

## 2023-01-03 DIAGNOSIS — R351 Nocturia: Secondary | ICD-10-CM

## 2023-01-03 LAB — URINALYSIS, COMPLETE W/RFL CULTURE
Bacteria, UA: NONE SEEN /[HPF]
Bilirubin Urine: NEGATIVE
Glucose, UA: NEGATIVE
Hgb urine dipstick: NEGATIVE
Hyaline Cast: NONE SEEN /[LPF]
Ketones, ur: NEGATIVE
Leukocyte Esterase: NEGATIVE
Nitrites, Initial: NEGATIVE
Protein, ur: NEGATIVE
RBC / HPF: NONE SEEN /[HPF] (ref 0–2)
Specific Gravity, Urine: 1.015 (ref 1.001–1.035)
WBC, UA: NONE SEEN /[HPF] (ref 0–5)
pH: 5.5 (ref 5.0–8.0)

## 2023-01-03 LAB — WET PREP FOR TRICH, YEAST, CLUE

## 2023-01-03 LAB — NO CULTURE INDICATED

## 2023-01-03 NOTE — Progress Notes (Signed)
   Acute Office Visit  Subjective:    Patient ID: Mary Jimenez, female    DOB: 1965-12-27, 57 y.o.   MRN: [id number]   HPI 57 y.o. presents today for urinary frequency x 2 months. Worse at night and worse since her foot injury in September. She has been sleeping on her back since to elevate foot, which is not normal for her. Has cut back on water intake in the afternoon. Started B complex vitamin around time symptoms started, so she discontinued and feels symptoms are a little better. Avoiding bladder irritants. Denies urgency, dysuria, back pain, or hematuria. Denies vaginal symptoms. Normal UA with PCP 12/22/22. Prescription for Myrbetriq provided but she wants to find out etiology first. Using vaginal estrogen twice weekly.   Patient's last menstrual period was 09/11/2019.    Review of Systems  Constitutional: Negative.   Genitourinary:  Positive for frequency. Negative for difficulty urinating, dysuria, flank pain, hematuria, pelvic pain, urgency, vaginal discharge and vaginal pain.       Objective:    Physical Exam Constitutional:      Appearance: Normal appearance.  Genitourinary:    General: Normal vulva.     Vagina: Vaginal discharge present. No erythema or prolapsed vaginal walls.     Cervix: Normal.     Uterus: Normal. No uterine prolapse.      Adnexa: Right adnexa normal and left adnexa normal.     BP 112/80   Pulse 86   LMP 09/11/2019   SpO2 96%  Wt Readings from Last 3 Encounters:  12/22/22 168 lb 2 oz (76.3 kg)  10/10/22 166 lb 2 oz (75.4 kg)  07/21/22 166 lb (75.3 kg)        Patient informed chaperone available to be present for breast and/or pelvic exam. Patient has requested no chaperone to be present. Patient has been advised what will be completed during breast and pelvic exam.   Wet prep negative for pathogens UA negative  Assessment & Plan:   Problem List Items Addressed This Visit   None Visit Diagnoses     Frequency of urination    -   Primary   Relevant Orders   Urinalysis,Complete w/RFL Culture   Vaginal discharge       Relevant Orders   WET PREP FOR TRICH, YEAST, CLUE   Nocturia       Pelvic floor weakness          Plan: UA and wet prep negative. Pelvic floor weakness present, no evidence of prolapse. Recommend avoiding fluids 2-3 hours before bed, try different sleeping positions since it has been worse since lying on back. Options provided to try Myrbetriq, pelvic floor PT and/or urology referral. She will consider her options and reach out.   Return if symptoms worsen or fail to improve.    Olivia Mackie DNP, 2:41 PM 01/03/2023

## 2023-01-04 ENCOUNTER — Other Ambulatory Visit (INDEPENDENT_AMBULATORY_CARE_PROVIDER_SITE_OTHER): Payer: BLUE CROSS/BLUE SHIELD

## 2023-01-04 ENCOUNTER — Ambulatory Visit: Payer: BLUE CROSS/BLUE SHIELD | Admitting: Physician Assistant

## 2023-01-04 ENCOUNTER — Encounter: Payer: Self-pay | Admitting: Physician Assistant

## 2023-01-04 DIAGNOSIS — S92352A Displaced fracture of fifth metatarsal bone, left foot, initial encounter for closed fracture: Secondary | ICD-10-CM

## 2023-01-04 NOTE — Progress Notes (Signed)
Office Visit Note   Patient: Mary Jimenez           Date of Birth: Sep 06, 1965           MRN: [id number] Visit Date: 01/04/2023              Requested by: [place], Betty G, MD [address] [place],  [place] [address] PCP: [place], Betty G, MD  No chief complaint on file.     HPI: Patient is a pleasant 57 year old woman who is now 7 weeks status post left fifth metatarsal shaft fracture.  She has had some delayed healing.  For that reason we have kept her nonweightbearing for an additional 2 weeks.  She has no complaints.   Assessment & Plan: Visit Diagnoses: Left fifth metatarsal fracture.  Plan: She has had some progression of healing and no crepitus with manipulation of her foot today very little pain.  I would like for her to start weightbearing as tolerated in her shoe.  She is taking quite a bit of calcium she probably should shoot for about 1200 mg a day.  Like to see her back in 2 weeks for exam and final x-rays if she is doing well would be able to  Follow-Up Instructions: 2 weeks  Ortho Exam  Patient is alert, oriented, no adenopathy, well-dressed, normal affect, normal respiratory effort. Left foot she is neurovascularly intact pulses are intact she has no crepitus with manipulation of the fifth metatarsal moves as 1.  She has minimal tenderness to deep palpation.  Brisk capillary refill in her toes  Imaging: No results found. No images are attached to the encounter.  Labs: Lab Results  Component Value Date   HGBA1C 5.3 05/15/2022   HGBA1C 5.1 01/21/2019     Lab Results  Component Value Date   ALBUMIN 4.7 01/21/2019    No results found for: "MG" Lab Results  Component Value Date   VD25OH 64.75 01/21/2019    No results found for: "PREALBUMIN"    Latest Ref Rng & Units 05/12/2021   12:46 PM 09/12/2019    3:37 PM 01/21/2019   12:17 PM  CBC EXTENDED  WBC 3.8 - 10.8 Thousand/uL 4.3  5.8  4.4   RBC 3.80 - 5.10 Million/uL 4.29  4.33  4.52    Hemoglobin 11.7 - 15.5 g/dL 14.4  31.5  40.0   HCT 35.0 - 45.0 % 38.9  39.9  41.0   Platelets 140 - 400 Thousand/uL 215  211  194.0   NEUT# 1,500 - 7,800 cells/uL 2,752     Lymph# 850 - 3,900 cells/uL 1,066        There is no height or weight on file to calculate BMI.  Orders:  No orders of the defined types were placed in this encounter.  No orders of the defined types were placed in this encounter.    Procedures: No procedures performed  Clinical Data: No additional findings.  ROS:  All other systems negative, except as noted in the HPI. Review of Systems  Objective: Vital Signs: LMP 09/11/2019   Specialty Comments:  No specialty comments available.  PMFS History: Patient Active Problem List   Diagnosis Date Noted   Closed displaced fracture of fifth metatarsal bone of left foot 11/14/2022   Tinnitus of both ears 10/10/2022   Hemangioma of liver 07/12/2022   Routine general medical examination at a health care facility 05/15/2022   B12 deficiency 05/15/2022   Severe episode of recurrent major depressive  disorder, without psychotic features (HCC) 08/15/2021   Insomnia 12/19/2017   Anxiety disorder 12/19/2017   Allergic rhinitis 12/19/2017   Hot flashes due to menopause 12/19/2017   Hx of sessile serrated colonic polyp 09/17/2017   Past Medical History:  Diagnosis Date   Anal fissure    Anemia    Anxiety    Asthma    Depression    History of chicken pox    History of colon polyps    Hx of sessile serrated colonic polyp 09/17/2017   12 mm ssp    Family History  Problem Relation Age of Onset   Arthritis Mother    Asthma Mother    Depression Mother    Hypertension Mother    Kidney failure Father    Asthma Sister    Hypertension Sister    Miscarriages / [place] Sister    Alcohol abuse Maternal Grandmother    Lung cancer Maternal Grandmother    Cancer Maternal Grandfather        ? type   Uterine cancer Maternal Aunt     Past Surgical  History:  Procedure Laterality Date   CERVICAL POLYPECTOMY     HEMANGIOMA EXCISION  05/2012   liver hemangeoma removed and partial liver   TONSILLECTOMY AND ADENOIDECTOMY     Social History   Occupational History   Not on file  Tobacco Use   Smoking status: Former    Current packs/day: 0.00    Average packs/day: 1 pack/day for 7.0 years (7.0 ttl pk-yrs)    Types: Cigarettes    Start date: 24    Quit date: 1995    Years since quitting: 29.8   Smokeless tobacco: Never   Tobacco comments:    smoked off and on  Vaping Use   Vaping status: Never Used  Substance and Sexual Activity   Alcohol use: Not Currently    Comment: occasional beer   Drug use: Never   Sexual activity: Yes    Birth control/protection: Post-menopausal    Comment: 1st intercourse 57 yo-Fewer than 5 partners

## 2023-01-18 ENCOUNTER — Ambulatory Visit: Payer: BLUE CROSS/BLUE SHIELD | Admitting: Physician Assistant

## 2023-01-24 ENCOUNTER — Ambulatory Visit: Payer: BLUE CROSS/BLUE SHIELD | Admitting: Physician Assistant

## 2023-02-06 ENCOUNTER — Ambulatory Visit: Payer: BLUE CROSS/BLUE SHIELD | Admitting: Physician Assistant

## 2023-02-06 ENCOUNTER — Ambulatory Visit: Payer: BLUE CROSS/BLUE SHIELD | Admitting: Family Medicine

## 2023-02-06 VITALS — BP 124/80 | HR 75 | Resp 12 | Ht 67.0 in | Wt 169.5 lb

## 2023-02-06 DIAGNOSIS — F332 Major depressive disorder, recurrent severe without psychotic features: Secondary | ICD-10-CM | POA: Diagnosis not present

## 2023-02-06 DIAGNOSIS — F419 Anxiety disorder, unspecified: Secondary | ICD-10-CM | POA: Diagnosis not present

## 2023-02-06 MED ORDER — BUSPIRONE HCL 5 MG PO TABS
5.0000 mg | ORAL_TABLET | Freq: Three times a day (TID) | ORAL | 0 refills | Status: DC
Start: 2023-02-06 — End: 2023-08-16

## 2023-02-06 MED ORDER — PROPRANOLOL HCL 10 MG PO TABS: 10.0000 mg | ORAL_TABLET | Freq: Two times a day (BID) | ORAL | 0 refills | Status: AC | PRN

## 2023-02-06 NOTE — Progress Notes (Signed)
ACUTE VISIT Chief Complaint  Patient presents with   Anxiety   HPI: Mary Jimenez is a 57 y.o. female with a PMHx significant for closed displaced fracture of fifth metatarsal bone of left foot, anxiety, depression, B12 deficiency, and insomnia, who is here today complaining of anxiety.   Anxiety:  Patient complains of elevated anxiety leading up to an upcoming court date for alimony in 10 days. She says she is finding it incredibly difficult to get paperwork done because she has panic attacks whenever she sits down to do it. She is currently on Wellbutrin 75 mg bid for depression, which has helped with her depression, but not her anxiety. She has tried meditation, which hasn't helped, and has a meeting with her psychotherapist on 12/6.  She mentions she took Prozac ~15 years ago, but has never taken Xanax or any other benzodiazepines.  She denies abdominal pain, nausea, vomiting, or diarrhea.  Polyuria:  She mentions her frequent urination has completely resolved since her last visit.   Review of Systems See other pertinent positives and negatives in HPI.  Current Outpatient Medications on File Prior to Visit  Medication Sig Dispense Refill   albuterol (VENTOLIN HFA) 108 (90 Base) MCG/ACT inhaler Inhale into the lungs.     buPROPion (WELLBUTRIN) 75 MG tablet Take 1 tablet (75 mg total) by mouth 2 (two) times daily. 180 tablet 1   cetirizine (ZYRTEC) 10 MG tablet Take 10 mg by mouth daily.     cyanocobalamin (VITAMIN B12) 500 MCG tablet Take by mouth.     estradiol (ESTRACE) 0.1 MG/GM vaginal cream Place 1 Applicatorful vaginally 2 (two) times a week. Insert 2 grams twice weekly 42.5 g 2   estradiol (VIVELLE-DOT) 0.0375 MG/24HR Place 1 patch onto the skin 2 (two) times a week. 24 patch 3   mirabegron ER (MYRBETRIQ) 25 MG TB24 tablet Take 1 tablet (25 mg total) by mouth daily. 30 tablet 1   Multiple Vitamin (MULTIVITAMIN) capsule Take 1 capsule by mouth daily.      progesterone (PROMETRIUM) 100 MG capsule Take 1 capsule (100 mg total) by mouth daily. 90 capsule 3   traZODone (DESYREL) 50 MG tablet Take 1-2 tablets (50-100 mg total) by mouth at bedtime. 60 tablet 1   triamcinolone cream (KENALOG) 0.1 % Apply 1 application  topically 2 (two) times daily as needed. 30 g 1   No current facility-administered medications on file prior to visit.    Past Medical History:  Diagnosis Date   Anal fissure    Anemia    Anxiety    Asthma    Depression    History of chicken pox    History of colon polyps    Hx of sessile serrated colonic polyp 09/17/2017   12 mm ssp   Allergies  Allergen Reactions   Pseudoephedrine Other (See Comments)    Actifed caused hallucinations as a child   Latex Itching    Social History   Socioeconomic History   Marital status: Legally Separated    Spouse name: Not on file   Number of children: 2   Years of education: Not on file   Highest education level: Some college, no degree  Occupational History   Not on file  Tobacco Use   Smoking status: Former    Current packs/day: 0.00    Average packs/day: 1 pack/day for 7.0 years (7.0 ttl pk-yrs)    Types: Cigarettes    Start date: 62    Quit date: 1995  Years since quitting: 29.9   Smokeless tobacco: Never   Tobacco comments:    smoked off and on  Vaping Use   Vaping status: Never Used  Substance and Sexual Activity   Alcohol use: Not Currently    Comment: occasional beer   Drug use: Never   Sexual activity: Yes    Birth control/protection: Post-menopausal    Comment: 1st intercourse 57 yo-Fewer than 5 partners  Other Topics Concern   Not on file  Social History Narrative   Not on file   Social Determinants of Health   Financial Resource Strain: High Risk (02/06/2023)   Overall Financial Resource Strain (CARDIA)    Difficulty of Paying Living Expenses: Very hard  Food Insecurity: Food Insecurity Present (02/06/2023)   Hunger Vital Sign    Worried  About Running Out of Food in the Last Year: Sometimes true    Ran Out of Food in the Last Year: Sometimes true  Transportation Needs: No Transportation Needs (02/06/2023)   PRAPARE - Administrator, Civil Service (Medical): No    Lack of Transportation (Non-Medical): No  Physical Activity: Sufficiently Active (02/06/2023)   Exercise Vital Sign    Days of Exercise per Week: 4 days    Minutes of Exercise per Session: 40 min  Stress: Stress Concern Present (02/06/2023)   [employer] of Occupational Health - Occupational Stress Questionnaire    Feeling of Stress : Very much  Social Connections: Socially Isolated (02/06/2023)   Social Connection and Isolation Panel [NHANES]    Frequency of Communication with Friends and Family: More than three times a week    Frequency of Social Gatherings with Friends and Family: More than three times a week    Attends Religious Services: Never    Database administrator or Organizations: No    Attends Engineer, structural: Not on file    Marital Status: Separated    There were no vitals filed for this visit. Body mass index is 26.33 kg/m.  Physical Exam Vitals and nursing note reviewed.  Constitutional:      General: She is not in acute distress.    Appearance: She is well-developed.  HENT:     Head: Normocephalic and atraumatic.     Mouth/Throat:     Mouth: Mucous membranes are moist.     Pharynx: Oropharynx is clear.  Eyes:     Conjunctiva/sclera: Conjunctivae normal.  Cardiovascular:     Rate and Rhythm: Normal rate and regular rhythm.     Heart sounds: No murmur heard. Pulmonary:     Effort: Pulmonary effort is normal. No respiratory distress.     Breath sounds: Normal breath sounds.  Abdominal:     Palpations: Abdomen is soft. There is no hepatomegaly or mass.     Tenderness: There is no abdominal tenderness.  Lymphadenopathy:     Cervical: No cervical adenopathy.  Skin:    General: Skin is warm.      Findings: No erythema or rash.  Neurological:     General: No focal deficit present.     Mental Status: She is alert and oriented to person, place, and time.     Cranial Nerves: No cranial nerve deficit.     Gait: Gait normal.  Psychiatric:     Comments: Well groomed, good eye contact.     ASSESSMENT AND PLAN:  Ms. Bachrach was seen today for anxiety.   There are no diagnoses linked to this encounter.  No follow-ups  on file.  I, Suanne Marker, acting as a scribe for Janney Priego [place], MD., have documented all relevant documentation on the behalf of Ryman Rathgeber [place], MD, as directed by  William Schake [place], MD while in the presence of Rana Hochstein [place], MD.   I, Suanne Marker, have reviewed all documentation for this visit. The documentation on 02/06/23 for the exam, diagnosis, procedures, and orders are all accurate and complete.  Arnez Stoneking G. [place], MD  [facility]. [place] office.  Discharge Instructions   None

## 2023-02-06 NOTE — Patient Instructions (Signed)
A few things to remember from today's visit:  No diagnosis found. Today we are starting Buspar 5 mg 3 times daily and Propranolol 2 times as needed. No changes in Wellbutrin.  If you need refills for medications you take chronically, please call your pharmacy. Do not use My Chart to request refills or for acute issues that need immediate attention. If you send a my chart message, it may take a few days to be addressed, specially if I am not in the office.  Please be sure medication list is accurate. If a new problem present, please set up appointment sooner than planned today.

## 2023-02-08 ENCOUNTER — Ambulatory Visit: Payer: BLUE CROSS/BLUE SHIELD | Admitting: Physician Assistant

## 2023-02-08 ENCOUNTER — Encounter: Payer: Self-pay | Admitting: Physician Assistant

## 2023-02-08 ENCOUNTER — Other Ambulatory Visit (INDEPENDENT_AMBULATORY_CARE_PROVIDER_SITE_OTHER): Payer: BLUE CROSS/BLUE SHIELD

## 2023-02-08 DIAGNOSIS — S92352A Displaced fracture of fifth metatarsal bone, left foot, initial encounter for closed fracture: Secondary | ICD-10-CM | POA: Diagnosis not present

## 2023-02-08 NOTE — Progress Notes (Signed)
Office Visit Note   Patient: Mary Jimenez           Date of Birth: [birth year]           MRN: [id number] Visit Date: 02/08/2023              Requested by: [place], Betty G, MD [address] [place],  [place] [address] PCP: [place], Betty G, MD  Left foot pain    HPI: Patient is a pleasant 57 year old woman who is now 3 months out status post left fifth metatarsal shaft fracture.  She has had a little bit delayed healing.  She is walking in a regular tennis shoe today says she is feeling better  Assessment & Plan: Visit Diagnoses:  1. Closed displaced fracture of fifth metatarsal bone of left foot, initial encounter     Plan: Clinically bone is moving is 1 piece without any crepitus she is much less tender.  Radiographs have also improved.  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  I will place an order for physical therapy but I given her exercises to do in the meantime can follow-up in 6 weeks have also instructed her on working on gait training on her own and should be on vacation  Follow-Up Instructions: 6 weeks  Ortho Exam  Patient is alert, oriented, no adenopathy, well-dressed, normal affect, normal respiratory effort. She is walking with an antalgic gait.  She is neurovascularly intact minimal tenderness even to deep palpation of the area of the fracture she has good dorsiflexion plantarflexion eversion inversion compartments are soft and compressible  Imaging: No results found. No images are attached to the encounter.  Labs: Lab Results  Component Value Date   HGBA1C 5.3 05/15/2022   HGBA1C 5.1 01/21/2019     Lab Results  Component Value Date   ALBUMIN 4.7 01/21/2019    No results found for: "MG" Lab Results  Component Value Date   VD25OH 64.75 01/21/2019    No results found for: "PREALBUMIN"    Latest Ref Rng & Units 05/12/2021   12:46 PM 09/12/2019    3:37 PM 01/21/2019   12:17 PM  CBC EXTENDED  WBC 3.8 - 10.8  Thousand/uL 4.3  5.8  4.4   RBC 3.80 - 5.10 Million/uL 4.29  4.33  4.52   Hemoglobin 11.7 - 15.5 g/dL 08.6  57.8  46.9   HCT 35.0 - 45.0 % 38.9  39.9  41.0   Platelets 140 - 400 Thousand/uL 215  211  194.0   NEUT# 1,500 - 7,800 cells/uL 2,752     Lymph# 850 - 3,900 cells/uL 1,066        There is no height or weight on file to calculate BMI.  Orders:  Orders Placed This Encounter  Procedures   XR Foot Complete Left   No orders of the defined types were placed in this encounter.    Procedures: No procedures performed  Clinical Data: No additional findings.  ROS:  All other systems negative, except as noted in the HPI. Review of Systems  Objective: Vital Signs: LMP 09/11/2019   Specialty Comments:  No specialty comments available.  PMFS History: Patient Active Problem List   Diagnosis Date Noted   Closed displaced fracture of fifth metatarsal bone of left foot 11/14/2022   Tinnitus of both ears 10/10/2022   Hemangioma of liver 07/12/2022   Routine general medical examination at a health care facility 05/15/2022   B12 deficiency 05/15/2022  Severe episode of recurrent major depressive disorder, without psychotic features (HCC) 08/15/2021   Insomnia 12/19/2017   Anxiety disorder 12/19/2017   Allergic rhinitis 12/19/2017   Hot flashes due to menopause 12/19/2017   Hx of sessile serrated colonic polyp 09/17/2017   Past Medical History:  Diagnosis Date   Anal fissure    Anemia    Anxiety    Asthma    Depression    History of chicken pox    History of colon polyps    Hx of sessile serrated colonic polyp 09/17/2017   12 mm ssp    Family History  Problem Relation Age of Onset   Arthritis Mother    Asthma Mother    Depression Mother    Hypertension Mother    Kidney failure Father    Asthma Sister    Hypertension Sister    Miscarriages / [place] Sister    Alcohol abuse Maternal Grandmother    Lung cancer Maternal Grandmother    Cancer Maternal  Grandfather        ? type   Uterine cancer Maternal Aunt     Past Surgical History:  Procedure Laterality Date   CERVICAL POLYPECTOMY     HEMANGIOMA EXCISION  05/2012   liver hemangeoma removed and partial liver   TONSILLECTOMY AND ADENOIDECTOMY     Social History   Occupational History   Not on file  Tobacco Use   Smoking status: Former    Current packs/day: 0.00    Average packs/day: 1 pack/day for 7.0 years (7.0 ttl pk-yrs)    Types: Cigarettes    Start date: 49    Quit date: 1995    Years since quitting: 29.9   Smokeless tobacco: Never   Tobacco comments:    smoked off and on  Vaping Use   Vaping status: Never Used  Substance and Sexual Activity   Alcohol use: Not Currently    Comment: occasional beer   Drug use: Never   Sexual activity: Yes    Birth control/protection: Post-menopausal    Comment: 1st intercourse 57 yo-Fewer than 5 partners

## 2023-02-19 ENCOUNTER — Encounter: Payer: Self-pay | Admitting: Nurse Practitioner

## 2023-02-19 DIAGNOSIS — N951 Menopausal and female climacteric states: Secondary | ICD-10-CM

## 2023-02-19 DIAGNOSIS — N952 Postmenopausal atrophic vaginitis: Secondary | ICD-10-CM

## 2023-02-19 MED ORDER — ESTRADIOL 0.1 MG/GM VA CREA: 1.0000 | TOPICAL_CREAM | VAGINAL | 2 refills | Status: DC

## 2023-02-19 NOTE — Telephone Encounter (Signed)
Med refill request:estradiol vaginal cream, 2 grams twice weekly Last AEX: 06/13/22 -TW Next AEX: Not scheduled. MyChart message to patient.  Last MMG (if hormonal med) 2019, Normal Refill authorized: Please Advise?

## 2023-03-15 ENCOUNTER — Ambulatory Visit: Payer: BLUE CROSS/BLUE SHIELD | Admitting: Physical Therapy

## 2023-03-26 ENCOUNTER — Other Ambulatory Visit: Payer: Self-pay

## 2023-03-26 DIAGNOSIS — N952 Postmenopausal atrophic vaginitis: Secondary | ICD-10-CM

## 2023-03-26 DIAGNOSIS — N951 Menopausal and female climacteric states: Secondary | ICD-10-CM

## 2023-03-26 NOTE — Telephone Encounter (Signed)
Med refill request:estradiol vaginal cream Last AEX: 06/13/22 Next AEX:not yet scheduled Last MMG (if hormonal med) Refill authorized: Last Rx sent #42.5g with 2 refills on 02/19/2023. Please approve or deny as appropriate.

## 2023-04-03 ENCOUNTER — Encounter: Payer: Self-pay | Admitting: Nurse Practitioner

## 2023-04-03 ENCOUNTER — Ambulatory Visit: Payer: BLUE CROSS/BLUE SHIELD | Admitting: Nurse Practitioner

## 2023-04-03 VITALS — BP 126/78 | HR 87 | Wt 167.0 lb

## 2023-04-03 DIAGNOSIS — R102 Pelvic and perineal pain: Secondary | ICD-10-CM

## 2023-04-03 DIAGNOSIS — Z79890 Hormone replacement therapy (postmenopausal): Secondary | ICD-10-CM

## 2023-04-03 DIAGNOSIS — M545 Low back pain, unspecified: Secondary | ICD-10-CM

## 2023-04-03 DIAGNOSIS — N950 Postmenopausal bleeding: Secondary | ICD-10-CM

## 2023-04-03 LAB — URINALYSIS, COMPLETE W/RFL CULTURE
Bacteria, UA: NONE SEEN /[HPF]
Bilirubin Urine: NEGATIVE
Glucose, UA: NEGATIVE
Hgb urine dipstick: NEGATIVE
Hyaline Cast: NONE SEEN /[LPF]
Ketones, ur: NEGATIVE
Leukocyte Esterase: NEGATIVE
Nitrites, Initial: NEGATIVE
Protein, ur: NEGATIVE
RBC / HPF: NONE SEEN /[HPF] (ref 0–2)
Specific Gravity, Urine: 1.02 (ref 1.001–1.035)
WBC, UA: NONE SEEN /[HPF] (ref 0–5)
pH: 5.5 (ref 5.0–8.0)

## 2023-04-03 LAB — NO CULTURE INDICATED

## 2023-04-03 NOTE — Progress Notes (Signed)
   Acute Office Visit  Subjective:    Patient ID: Mary Jimenez, female    DOB: October 02, 1965, 58 y.o.   MRN: [id number]   HPI 58 y.o. presents today for cramping and spotting x 4 days. Bleeding has been light but did pass a clot. Having back pain and cramping. Denies urinary or vaginal symptoms. Postmenopausal since 2021 - on HRT. Was on day late on switching her patch last week, but otherwise takes uses as prescribed. Using vaginal estrogen. Not sexually active.   Patient's last menstrual period was 09/11/2019.    Review of Systems  Constitutional: Negative.   Genitourinary:  Positive for pelvic pain (Cramping) and vaginal bleeding. Negative for difficulty urinating, dysuria, flank pain, frequency, hematuria, urgency, vaginal discharge and vaginal pain.  Musculoskeletal:  Positive for back pain.       Objective:    Physical Exam Constitutional:      Appearance: Normal appearance.  Genitourinary:    General: Normal vulva.     Vagina: Vaginal discharge (Light brown) present. No erythema.     Cervix: Normal.     Uterus: Normal.      BP 126/78 (BP Location: Right Arm, Patient Position: Sitting, Cuff Size: Normal)   Pulse 87   Wt 167 lb (75.8 kg)   LMP 09/11/2019   SpO2 98%   BMI 26.16 kg/m  Wt Readings from Last 3 Encounters:  04/03/23 167 lb (75.8 kg)  02/06/23 169 lb 8 oz (76.9 kg)  12/22/22 168 lb 2 oz (76.3 kg)        Patient informed chaperone available to be present for breast and/or pelvic exam. Patient has requested no chaperone to be present. Patient has been advised what will be completed during breast and pelvic exam.   UA negative  Assessment & Plan:   Problem List Items Addressed This Visit   None Visit Diagnoses       Postmenopausal bleeding    -  Primary   Relevant Orders   US PELVIS TRANSVAGINAL NON-OB (TV ONLY)     Postmenopausal hormone therapy         Acute midline low back pain without sciatica       Relevant Orders    Urinalysis,Complete w/RFL Culture      Plan: Will schedule pelvic ultrasound. UA negative.    Olivia Mackie DNP, 11:35 AM 04/03/2023

## 2023-04-12 ENCOUNTER — Ambulatory Visit: Payer: BLUE CROSS/BLUE SHIELD | Admitting: Nurse Practitioner

## 2023-04-12 ENCOUNTER — Encounter: Payer: Self-pay | Admitting: Nurse Practitioner

## 2023-04-12 ENCOUNTER — Ambulatory Visit (INDEPENDENT_AMBULATORY_CARE_PROVIDER_SITE_OTHER): Payer: BLUE CROSS/BLUE SHIELD

## 2023-04-12 VITALS — BP 118/76 | HR 71 | Wt 169.0 lb

## 2023-04-12 DIAGNOSIS — N858 Other specified noninflammatory disorders of uterus: Secondary | ICD-10-CM

## 2023-04-12 DIAGNOSIS — Z79890 Hormone replacement therapy (postmenopausal): Secondary | ICD-10-CM

## 2023-04-12 DIAGNOSIS — N950 Postmenopausal bleeding: Secondary | ICD-10-CM

## 2023-04-12 DIAGNOSIS — R9389 Abnormal findings on diagnostic imaging of other specified body structures: Secondary | ICD-10-CM | POA: Diagnosis not present

## 2023-04-12 MED ORDER — PROGESTERONE 200 MG PO CAPS: 200.0000 mg | ORAL_CAPSULE | Freq: Every day | ORAL | 0 refills | Status: DC

## 2023-04-12 NOTE — Progress Notes (Signed)
   Acute Office Visit  Subjective:    Patient ID: Mary Jimenez, female    DOB: March 31, 1965, 58 y.o.   MRN: [id number]   HPI 58 y.o. presents today for ultrasound. Seen 1/28 with complaints of cramping and spotting x 4 days. Bleeding was very light but did pass a clot. Having back pain and cramping. Denies urinary or vaginal symptoms. Postmenopausal since 2021 - on HRT. Was on day late on switching her patch the week before, but otherwise uses as prescribed. Using vaginal estrogen. Not sexually active.   Patient's last menstrual period was 09/11/2019.    Review of Systems  Constitutional: Negative.   Genitourinary:  Negative for vaginal bleeding.       Objective:    Physical Exam Constitutional:      Appearance: Normal appearance.   GU: Not indicated  BP 118/76 (BP Location: Right Arm, Patient Position: Sitting, Cuff Size: Normal)   Pulse 71   Wt 169 lb (76.7 kg)   LMP 09/11/2019   SpO2 98%   BMI 26.47 kg/m  Wt Readings from Last 3 Encounters:  04/12/23 169 lb (76.7 kg)  04/03/23 167 lb (75.8 kg)  02/06/23 169 lb 8 oz (76.9 kg)        Assessment & Plan:   Problem List Items Addressed This Visit   None Visit Diagnoses       Postmenopausal bleeding    -  Primary     Postmenopausal hormone therapy       Relevant Medications   progesterone  (PROMETRIUM ) 200 MG capsule     Thickened endometrium         Uterine mass          Both transabdominal and transvaginal techniques necessary to complete evaluation:  Anteverted uterus, single 1.0 cm fibroid anteriorly.  Irregularly thickened endometrium with cystic change - 6.6 mm. 3D imaging suspicious for intracavitary mass.  Both ovaries small with atrophic appearance.  No adnexal masses, no free fluid.  Plan: Ultrasound reviewed with patient. Discussed thickened endometrium and recommendations for EMB. SHGM recommended for possible intracavitary mass, likely a polyp. Will schedule with MD. Increase Prometrium  to 200  mg nightly.     Annabella DELENA Shutter DNP, 1:49 PM 04/12/2023

## 2023-04-21 ENCOUNTER — Other Ambulatory Visit: Payer: Self-pay | Admitting: Family Medicine

## 2023-04-21 DIAGNOSIS — F332 Major depressive disorder, recurrent severe without psychotic features: Secondary | ICD-10-CM

## 2023-05-09 ENCOUNTER — Other Ambulatory Visit: Payer: Self-pay | Admitting: Nurse Practitioner

## 2023-05-09 DIAGNOSIS — Z79890 Hormone replacement therapy (postmenopausal): Secondary | ICD-10-CM

## 2023-05-09 NOTE — Telephone Encounter (Signed)
 Med refill request: progesterone 200 mg Last OV 04/12/23 ultrasound check, needed follow up with MD--no appointment scheduled. Last AEX: 06/13/22 Next AEX: none scheduled Last MMG (if hormonal med) 2019 normal Refill sent to provider for approval or denial as appropriate.  Noted on RX, needs appointment.

## 2023-05-10 ENCOUNTER — Other Ambulatory Visit: Payer: BLUE CROSS/BLUE SHIELD | Admitting: Obstetrics and Gynecology

## 2023-05-10 ENCOUNTER — Other Ambulatory Visit: Payer: BLUE CROSS/BLUE SHIELD

## 2023-05-28 ENCOUNTER — Encounter: Payer: Self-pay | Admitting: Nurse Practitioner

## 2023-06-05 ENCOUNTER — Other Ambulatory Visit: Payer: Self-pay | Admitting: Nurse Practitioner

## 2023-06-05 DIAGNOSIS — Z79890 Hormone replacement therapy (postmenopausal): Secondary | ICD-10-CM

## 2023-06-06 NOTE — Telephone Encounter (Signed)
 Med refill request: Progesterone Last AEX: 06/13/22 TW Next AEX: 06/27/23 GH Last MMG (if hormonal med) 09/28/2017 Refill authorized: Last Rx sent #30 with zero refills on 05/09/2023. Please approve or deny as appropriate.

## 2023-06-16 ENCOUNTER — Other Ambulatory Visit: Payer: Self-pay | Admitting: Nurse Practitioner

## 2023-06-16 DIAGNOSIS — Z79890 Hormone replacement therapy (postmenopausal): Secondary | ICD-10-CM

## 2023-06-18 NOTE — Telephone Encounter (Signed)
 Medication refill request: estradiol patch  Last AEX:  06/13/22 Next AEX: 06/27/23 with gh  Last MMG (if hormonal medication request): 2019 normal  Refill authorized: please advise

## 2023-06-19 NOTE — Telephone Encounter (Signed)
 Contraindication: PMB

## 2023-06-21 ENCOUNTER — Other Ambulatory Visit: Payer: Self-pay | Admitting: Obstetrics and Gynecology

## 2023-06-21 DIAGNOSIS — R9389 Abnormal findings on diagnostic imaging of other specified body structures: Secondary | ICD-10-CM

## 2023-06-21 DIAGNOSIS — Z79890 Hormone replacement therapy (postmenopausal): Secondary | ICD-10-CM

## 2023-06-21 DIAGNOSIS — N858 Other specified noninflammatory disorders of uterus: Secondary | ICD-10-CM

## 2023-06-21 DIAGNOSIS — N950 Postmenopausal bleeding: Secondary | ICD-10-CM

## 2023-06-27 ENCOUNTER — Ambulatory Visit: Admitting: Obstetrics and Gynecology

## 2023-06-27 ENCOUNTER — Encounter: Payer: Self-pay | Admitting: Obstetrics and Gynecology

## 2023-06-27 ENCOUNTER — Ambulatory Visit (INDEPENDENT_AMBULATORY_CARE_PROVIDER_SITE_OTHER)

## 2023-06-27 VITALS — BP 118/72 | HR 70 | Wt 169.0 lb

## 2023-06-27 DIAGNOSIS — N840 Polyp of corpus uteri: Secondary | ICD-10-CM | POA: Diagnosis not present

## 2023-06-27 DIAGNOSIS — Z79890 Hormone replacement therapy (postmenopausal): Secondary | ICD-10-CM

## 2023-06-27 DIAGNOSIS — R9389 Abnormal findings on diagnostic imaging of other specified body structures: Secondary | ICD-10-CM | POA: Insufficient documentation

## 2023-06-27 DIAGNOSIS — N858 Other specified noninflammatory disorders of uterus: Secondary | ICD-10-CM | POA: Diagnosis not present

## 2023-06-27 NOTE — Patient Instructions (Addendum)
 It is common to have vaginal bleeding and cramping for up to 72 hours after your biopsy. Please call our office with heavy vaginal bleeding, severe abdominal pain or fever. Avoid intercourse, tampon use, douching and baths for 7 days to decrease the risk of infection.   Preoperative instructions: Nothing to eat or drink after midnight, unless instructed differently regarding clear liquids by the anesthesia team at [facility] health. Do not take any medications on the day of surgery, except those listed below: NONE Please follow all other instructions as provided by our surgical scheduler at [facility] and the anesthesia team at [facility] health

## 2023-06-27 NOTE — Assessment & Plan Note (Addendum)
 Reviewed TVUS and SIS with patient. Recommend hysteroscopy, dilation and curettage, polypectomy.  Discussed outpatient procedure. Reviewed that  recovery is usually 1-2 days. Risks including infections, bleeding, and damage to surrounding organs reviewed. Recommend NPO prior to midnight and reviewed medication to take on day of surgery. Dicussed use of NSAIDS as needed for pain postoperatively.  Continue nightly Prometrium , however recommend discontinuing estradiol  until endometrial sampling is complete.  Preop checklist: Antibiotics: none DVT ppx: SCD Postop visit: 1 week Additional clearance: none  LATEX ALLERGY

## 2023-06-27 NOTE — Progress Notes (Signed)
 58 y.o. [id number] female on HRT with postmenopausal bleeding, thickened endometrium, asthma, anxiety and depression here for SIS. Legally Separated- undergoing divorce. Sees T.Lajuana Pilar, NP for GYN care.  Patient's last menstrual period was 09/11/2019.   Reports 1 episode of bleeding. She has since increased Prometrium  to 200 mg nightly.  Feels better on current Prometrium  dose. No CP or SOB.  04/12/23 TVUS revealed thickened endometrium, measuring 6 mm.     Component Value Date/Time   DIAGPAP  05/12/2021 1241    - Negative for intraepithelial lesion or malignancy (NILM)   HPVHIGH Negative 05/12/2021 1241   ADEQPAP  05/12/2021 1241    Satisfactory for evaluation; transformation zone component PRESENT.   GYN HISTORY: No significant history  OB History  Gravida Para Term Preterm AB Living  3 2 2  1 2   SAB IAB Ectopic Multiple Live Births   1       # Outcome Date GA Lbr Len/2nd Weight Sex Type Anes PTL Lv  3 Term           2 Term           1 IAB             Past Medical History:  Diagnosis Date   Anal fissure    Anemia    Anxiety    Asthma    Depression    History of chicken pox    History of colon polyps    Hx of sessile serrated colonic polyp 09/17/2017   12 mm ssp    Past Surgical History:  Procedure Laterality Date   CERVICAL POLYPECTOMY     HEMANGIOMA EXCISION  05/2012   liver hemangeoma removed and partial liver   TONSILLECTOMY AND ADENOIDECTOMY      Current Outpatient Medications on File Prior to Visit  Medication Sig Dispense Refill   albuterol  (VENTOLIN  HFA) 108 (90 Base) MCG/ACT inhaler Inhale into the lungs.     buPROPion  (WELLBUTRIN ) 75 MG tablet TAKE 1 TABLET(75 MG) BY MOUTH TWICE DAILY 180 tablet 2   busPIRone  (BUSPAR ) 5 MG tablet Take 1 tablet (5 mg total) by mouth 3 (three) times daily. 90 tablet 0   cetirizine (ZYRTEC) 10 MG tablet Take 10 mg by mouth daily.     cyanocobalamin  (VITAMIN B12) 500 MCG tablet Take by mouth.     estradiol  (ESTRACE )  0.1 MG/GM vaginal cream Place 1 Applicatorful vaginally 2 (two) times a week. Insert 2 grams twice weekly 42.5 g 2   estradiol  (VIVELLE -DOT) 0.0375 MG/24HR Place 1 patch onto the skin 2 (two) times a week. 24 patch 3   Multiple Vitamin (MULTIVITAMIN) capsule Take 1 capsule by mouth daily.     N-ACETYL CYSTEINE PO Take by mouth.     progesterone  (PROMETRIUM ) 200 MG capsule TAKE 1 CAPSULE BY MOUTH DAILY **MUST CALL MD FOR APPOINTMENT 30 capsule 0   propranolol  (INDERAL ) 10 MG tablet Take 1 tablet (10 mg total) by mouth 2 (two) times daily as needed. 60 tablet 0   traZODone  (DESYREL ) 50 MG tablet Take 1-2 tablets (50-100 mg total) by mouth at bedtime. 60 tablet 1   triamcinolone  cream (KENALOG ) 0.1 % Apply 1 application  topically 2 (two) times daily as needed. 30 g 1   No current facility-administered medications on file prior to visit.    Allergies  Allergen Reactions   Pseudoephedrine Other (See Comments)    Actifed caused hallucinations as a child   Latex Itching      PE  Today's Vitals   06/27/23 1043  BP: 118/72  Pulse: 70  SpO2: 98%  Weight: 169 lb (76.7 kg)   Body mass index is 26.47 kg/m.  Physical Exam Vitals reviewed. Exam conducted with a chaperone present.  Constitutional:      General: She is not in acute distress.    Appearance: Normal appearance.  HENT:     Head: Normocephalic and atraumatic.     Nose: Nose normal.  Eyes:     Extraocular Movements: Extraocular movements intact.     Conjunctiva/sclera: Conjunctivae normal.  Cardiovascular:     Rate and Rhythm: Normal rate and regular rhythm.  Pulmonary:     Effort: Pulmonary effort is normal.     Breath sounds: Normal breath sounds. No wheezing, rhonchi or rales.  Genitourinary:    General: Normal vulva.     Exam position: Lithotomy position.     Vagina: Normal. No vaginal discharge.     Cervix: Normal. No cervical motion tenderness, discharge or lesion.     Uterus: Normal. Not enlarged and not tender.       Adnexa: Right adnexa normal and left adnexa normal.  Musculoskeletal:        General: Normal range of motion.     Cervical back: Normal range of motion.  Neurological:     General: No focal deficit present.     Mental Status: She is alert.  Psychiatric:        Mood and Affect: Mood normal.        Behavior: Behavior normal.     07/02/23 TVUS/SIS: Indications: Postmenopausal bleeding, thickened endometrium  Findings:   Uterus: Anteverted uterus Endometrial thickness: 6.6 mm. Left ovary: Not visualized Right ovary: 2.3 x 1.6 x 1.7 cm, normal-appearing No free fluid.  Procedure - sonohysterogram Consent performed. Speculum placed in vagina. Sterile prep of cervix with  betadine Cannula placed inside endometrial cavity without difficulty. Speculum removed. Sterile saline injected. 3 filling defects noted, measuring 1.0cm, 1.0cm, and 0.6cm. Cannula removed. No complication.   Procedure - endometrial biopsy was not performed. Patient would prefer hysteroscopic management.  Impression: 3 endometrial masses present.  Romaine Closs, MD   Assessment and Plan:        Thickened endometrium Assessment & Plan: Reviewed TVUS and SIS with patient. Recommend hysteroscopy, dilation and curettage, polypectomy.  Discussed outpatient procedure. Reviewed that  recovery is usually 1-2 days. Risks including infections, bleeding, and damage to surrounding organs reviewed. Recommend NPO prior to midnight and reviewed medication to take on day of surgery. Dicussed use of NSAIDS as needed for pain postoperatively.  Continue nightly Prometrium , however recommend discontinuing estradiol  until endometrial sampling is complete.  Preop checklist: Antibiotics: none DVT ppx: SCD Postop visit: 1 week Additional clearance: none  LATEX ALLERGY   Orders: -     Endometrial biopsy -     Ambulatory Referral For Surgery Scheduling  Endometrial polyp  Postmenopausal hormone therapy -      Endometrial biopsy     Treylen Gibbs Hadassah Letters, MD

## 2023-07-13 ENCOUNTER — Other Ambulatory Visit: Payer: Self-pay | Admitting: Nurse Practitioner

## 2023-07-13 ENCOUNTER — Encounter: Payer: Self-pay | Admitting: Nurse Practitioner

## 2023-07-13 DIAGNOSIS — Z79890 Hormone replacement therapy (postmenopausal): Secondary | ICD-10-CM

## 2023-07-13 MED ORDER — PROGESTERONE 200 MG PO CAPS: 200.0000 mg | ORAL_CAPSULE | Freq: Every day | ORAL | 0 refills | Status: DC

## 2023-07-13 NOTE — Telephone Encounter (Signed)
 Spoke with patient regarding surgery scheduling, patient states she is traveling on Sunday, requesting update on Rx refill for Progesterone  200mg . Patient states [facility] notified her today refill request denied.    Advised no refill request received for Progesterone  from pharmacy, advised I  see MyChart request from this afternoon. Patient states she spoke with Dr. Andrena Ke during her last OV and was advised to continue Progesterone  only.   Planning for surgery 08/24/23.   Advised time is now 1630, all providers have left for the day, could not guarantee refill would be completed by Sunday, will return call once reveiwed. Patient states she was advised by pharmacy refill request was sent and denied by office on 5/9, requesting refill prior to Sunday. Advised I will f/u.   Call placed to [facility], spoke with Callie. Refill request sent electronically for estradiol  at 1646 no progesterone  request  received since 06/05/23. Was advised likely sent via fax. Confirmed no fax request received in office.   No provider in office.  Call placed to to Dr. Andrena Ke, no answer.  Call placed to [facility] -reviewed, ok to send 30 day supply of Progesterone  200 mg, 0 RF.   Rx sent. Patient notified.   Routing FYI. See surgery referral for surgery.

## 2023-07-16 NOTE — Telephone Encounter (Signed)
 Medication refill request: estradiol   Last AEX:  06/13/22  Surgery 08/24/23 with GH  Last MMG (if hormonal medication request): 2019 normal per chart note Refill authorized: per phone note patient is no longer taking

## 2023-07-18 ENCOUNTER — Other Ambulatory Visit: Payer: Self-pay

## 2023-07-18 NOTE — Telephone Encounter (Signed)
 Opened in error

## 2023-07-25 ENCOUNTER — Ambulatory Visit (INDEPENDENT_AMBULATORY_CARE_PROVIDER_SITE_OTHER): Admitting: Obstetrics and Gynecology

## 2023-07-25 ENCOUNTER — Encounter: Payer: Self-pay | Admitting: Obstetrics and Gynecology

## 2023-07-25 VITALS — BP 122/72 | HR 85 | Ht 67.0 in | Wt 168.2 lb

## 2023-07-25 DIAGNOSIS — N840 Polyp of corpus uteri: Secondary | ICD-10-CM | POA: Diagnosis not present

## 2023-07-25 DIAGNOSIS — F332 Major depressive disorder, recurrent severe without psychotic features: Secondary | ICD-10-CM | POA: Diagnosis not present

## 2023-07-25 DIAGNOSIS — N950 Postmenopausal bleeding: Secondary | ICD-10-CM | POA: Diagnosis not present

## 2023-07-25 MED ORDER — BUPROPION HCL ER (XL) 300 MG PO TB24: 300.0000 mg | ORAL_TABLET | Freq: Every day | ORAL | 3 refills | Status: DC

## 2023-07-25 NOTE — Patient Instructions (Signed)
 Preoperative instructions: Nothing to eat or drink after midnight, unless instructed differently regarding clear liquids by the anesthesia team at [facility] health. Do not take any medications on the day of surgery, except those listed below: NONE Please follow all other instructions as provided by our surgical scheduler at [facility] and the anesthesia team at [facility] health.  Postoperative instructions: Take ibuprofen as prescribed and over-the-counter Tylenol as needed.

## 2023-07-25 NOTE — Progress Notes (Unsigned)
 58 y.o. [id number] female on HRT with postmenopausal bleeding, thickened endometrium, asthma, anxiety and depression here for preop visit: Diagnostic hysteroscopy, D&C scheduled for 08/24/2023. Legally Separated- undergoing divorce. Sees T.Lajuana Pilar, NP for GYN care.  Patient's last menstrual period was 09/11/2019.   Reports 1 episode of bleeding. She has since increased Prometrium  to 200 mg nightly.  Feels better on current Prometrium  dose.  04/12/23 TVUS revealed thickened endometrium, measuring 6 mm.  07/25/23 TVUS/SIS: Uterus: Anteverted uterus Endometrial thickness: 6.6 mm. Left ovary: Not visualized Right ovary: 2.3 x 1.6 x 1.7 cm, normal-appearing No free fluid. Impression: 3 endometrial masses present, measuring 1.0cm, 1.0cm, and 0.6cm.  Today, she reports noticing more depression and irritability, brain fog, night sweats. Divorce court date next week. No CP or SOB.     Component Value Date/Time   DIAGPAP  05/12/2021 1241    - Negative for intraepithelial lesion or malignancy (NILM)   HPVHIGH Negative 05/12/2021 1241   ADEQPAP  05/12/2021 1241    Satisfactory for evaluation; transformation zone component PRESENT.   GYN HISTORY: No significant history  OB History  Gravida Para Term Preterm AB Living  3 2 2  1 2   SAB IAB Ectopic Multiple Live Births   1       # Outcome Date GA Lbr Len/2nd Weight Sex Type Anes PTL Lv  3 Term           2 Term           1 IAB             Past Medical History:  Diagnosis Date   Anal fissure    Anemia    Anxiety    Asthma    Depression    History of chicken pox    History of colon polyps    Hx of sessile serrated colonic polyp 09/17/2017   12 mm ssp    Past Surgical History:  Procedure Laterality Date   CERVICAL POLYPECTOMY     HEMANGIOMA EXCISION  05/2012   liver hemangeoma removed and partial liver   TONSILLECTOMY AND ADENOIDECTOMY      Current Outpatient Medications on File Prior to Visit  Medication Sig Dispense  Refill   albuterol  (VENTOLIN  HFA) 108 (90 Base) MCG/ACT inhaler Inhale into the lungs.     busPIRone  (BUSPAR ) 5 MG tablet Take 1 tablet (5 mg total) by mouth 3 (three) times daily. 90 tablet 0   cetirizine (ZYRTEC) 10 MG tablet Take 10 mg by mouth daily.     cyanocobalamin  (VITAMIN B12) 500 MCG tablet Take by mouth.     Multiple Vitamin (MULTIVITAMIN) capsule Take 1 capsule by mouth daily.     N-ACETYL CYSTEINE PO Take by mouth.     progesterone  (PROMETRIUM ) 200 MG capsule Take 1 capsule (200 mg total) by mouth daily. 30 capsule 0   propranolol  (INDERAL ) 10 MG tablet Take 1 tablet (10 mg total) by mouth 2 (two) times daily as needed. 60 tablet 0   traZODone  (DESYREL ) 50 MG tablet Take 1-2 tablets (50-100 mg total) by mouth at bedtime. 60 tablet 1   triamcinolone  cream (KENALOG ) 0.1 % Apply 1 application  topically 2 (two) times daily as needed. 30 g 1   estradiol  (ESTRACE ) 0.1 MG/GM vaginal cream Place 1 Applicatorful vaginally 2 (two) times a week. Insert 2 grams twice weekly (Patient not taking: Reported on 07/25/2023) 42.5 g 2   estradiol  (VIVELLE -DOT) 0.0375 MG/24HR Place 1 patch onto the skin 2 (two) times a  week. (Patient not taking: Reported on 07/25/2023) 24 patch 3   No current facility-administered medications on file prior to visit.    Allergies  Allergen Reactions   Pseudoephedrine Other (See Comments)    Actifed caused hallucinations as a child   Latex Itching      PE Today's Vitals   07/25/23 1608  BP: 122/72  Pulse: 85  SpO2: 99%  Weight: 168 lb 3.2 oz (76.3 kg)  Height: 5\' 7"  (1.702 m)   Body mass index is 26.34 kg/m.  Physical Exam Vitals reviewed.  Constitutional:      General: She is not in acute distress.    Appearance: Normal appearance.  HENT:     Head: Normocephalic and atraumatic.     Nose: Nose normal.  Eyes:     Extraocular Movements: Extraocular movements intact.     Conjunctiva/sclera: Conjunctivae normal.  Cardiovascular:     Rate and  Rhythm: Normal rate and regular rhythm.     Heart sounds: No murmur heard.    No friction rub. No gallop.  Pulmonary:     Effort: Pulmonary effort is normal. No respiratory distress.     Breath sounds: Normal breath sounds. No stridor. No wheezing, rhonchi or rales.  Musculoskeletal:        General: Normal range of motion.     Cervical back: Normal range of motion.  Neurological:     General: No focal deficit present.     Mental Status: She is alert.  Psychiatric:        Mood and Affect: Mood normal.        Behavior: Behavior normal.    Assessment and Plan:        Postmenopausal bleeding  Endometrial polyp Assessment & Plan: Diagnostic hysteroscopy, dilation and curettage, polypectomy scheduled 08/24/23. No significant changes since last visit, however mood symptoms worsening since off estrogen and with divorce process.  Discussed outpatient procedure. Reviewed that  recovery is usually 1-2 days. Risks including infections, bleeding, and damage to surrounding organs reviewed. Recommend NPO prior to midnight and reviewed medication to take on day of surgery. Dicussed use of NSAIDS as needed for pain postoperatively.  Continue nightly Prometrium ,. Continue to hold estrogen products.  Preop checklist: Antibiotics: none DVT ppx: SCD Postop visit: 1 week Additional clearance: none  LATEX ALLERGY    Severe episode of recurrent major depressive disorder, without psychotic features (HCC) -     buPROPion  HCl ER (XL); Take 1 tablet (300 mg total) by mouth daily.  Dispense: 90 tablet; Refill: 3  Recommend increasing wellbutrin  to 300mg  every day, currently on 150mg  every day. Patient in agreement. New rx sent.  Romaine Closs, MD

## 2023-07-25 NOTE — H&P (View-Only) (Signed)
 58 y.o. [id number] female on HRT with postmenopausal bleeding, thickened endometrium, asthma, anxiety and depression here for preop visit: Diagnostic hysteroscopy, D&C scheduled for 08/24/2023. Legally Separated- undergoing divorce. Sees T.Lajuana Pilar, NP for GYN care.  Patient's last menstrual period was 09/11/2019.   Reports 1 episode of bleeding. She has since increased Prometrium  to 200 mg nightly.  Feels better on current Prometrium  dose.  04/12/23 TVUS revealed thickened endometrium, measuring 6 mm.  07/25/23 TVUS/SIS: Uterus: Anteverted uterus Endometrial thickness: 6.6 mm. Left ovary: Not visualized Right ovary: 2.3 x 1.6 x 1.7 cm, normal-appearing No free fluid. Impression: 3 endometrial masses present, measuring 1.0cm, 1.0cm, and 0.6cm.  Today, she reports noticing more depression and irritability, brain fog, night sweats. Divorce court date next week. No CP or SOB.     Component Value Date/Time   DIAGPAP  05/12/2021 1241    - Negative for intraepithelial lesion or malignancy (NILM)   HPVHIGH Negative 05/12/2021 1241   ADEQPAP  05/12/2021 1241    Satisfactory for evaluation; transformation zone component PRESENT.   GYN HISTORY: No significant history  OB History  Gravida Para Term Preterm AB Living  3 2 2  1 2   SAB IAB Ectopic Multiple Live Births   1       # Outcome Date GA Lbr Len/2nd Weight Sex Type Anes PTL Lv  3 Term           2 Term           1 IAB             Past Medical History:  Diagnosis Date   Anal fissure    Anemia    Anxiety    Asthma    Depression    History of chicken pox    History of colon polyps    Hx of sessile serrated colonic polyp 09/17/2017   12 mm ssp    Past Surgical History:  Procedure Laterality Date   CERVICAL POLYPECTOMY     HEMANGIOMA EXCISION  05/2012   liver hemangeoma removed and partial liver   TONSILLECTOMY AND ADENOIDECTOMY      Current Outpatient Medications on File Prior to Visit  Medication Sig Dispense  Refill   albuterol  (VENTOLIN  HFA) 108 (90 Base) MCG/ACT inhaler Inhale into the lungs.     busPIRone  (BUSPAR ) 5 MG tablet Take 1 tablet (5 mg total) by mouth 3 (three) times daily. 90 tablet 0   cetirizine (ZYRTEC) 10 MG tablet Take 10 mg by mouth daily.     cyanocobalamin  (VITAMIN B12) 500 MCG tablet Take by mouth.     Multiple Vitamin (MULTIVITAMIN) capsule Take 1 capsule by mouth daily.     N-ACETYL CYSTEINE PO Take by mouth.     progesterone  (PROMETRIUM ) 200 MG capsule Take 1 capsule (200 mg total) by mouth daily. 30 capsule 0   propranolol  (INDERAL ) 10 MG tablet Take 1 tablet (10 mg total) by mouth 2 (two) times daily as needed. 60 tablet 0   traZODone  (DESYREL ) 50 MG tablet Take 1-2 tablets (50-100 mg total) by mouth at bedtime. 60 tablet 1   triamcinolone  cream (KENALOG ) 0.1 % Apply 1 application  topically 2 (two) times daily as needed. 30 g 1   estradiol  (ESTRACE ) 0.1 MG/GM vaginal cream Place 1 Applicatorful vaginally 2 (two) times a week. Insert 2 grams twice weekly (Patient not taking: Reported on 07/25/2023) 42.5 g 2   estradiol  (VIVELLE -DOT) 0.0375 MG/24HR Place 1 patch onto the skin 2 (two) times a  week. (Patient not taking: Reported on 07/25/2023) 24 patch 3   No current facility-administered medications on file prior to visit.    Allergies  Allergen Reactions   Pseudoephedrine Other (See Comments)    Actifed caused hallucinations as a child   Latex Itching      PE Today's Vitals   07/25/23 1608  BP: 122/72  Pulse: 85  SpO2: 99%  Weight: 168 lb 3.2 oz (76.3 kg)  Height: 5\' 7"  (1.702 m)   Body mass index is 26.34 kg/m.  Physical Exam Vitals reviewed.  Constitutional:      General: She is not in acute distress.    Appearance: Normal appearance.  HENT:     Head: Normocephalic and atraumatic.     Nose: Nose normal.  Eyes:     Extraocular Movements: Extraocular movements intact.     Conjunctiva/sclera: Conjunctivae normal.  Cardiovascular:     Rate and  Rhythm: Normal rate and regular rhythm.     Heart sounds: No murmur heard.    No friction rub. No gallop.  Pulmonary:     Effort: Pulmonary effort is normal. No respiratory distress.     Breath sounds: Normal breath sounds. No stridor. No wheezing, rhonchi or rales.  Musculoskeletal:        General: Normal range of motion.     Cervical back: Normal range of motion.  Neurological:     General: No focal deficit present.     Mental Status: She is alert.  Psychiatric:        Mood and Affect: Mood normal.        Behavior: Behavior normal.    Assessment and Plan:        Postmenopausal bleeding  Endometrial polyp Assessment & Plan: Diagnostic hysteroscopy, dilation and curettage, polypectomy scheduled 08/24/23. No significant changes since last visit, however mood symptoms worsening since off estrogen and with divorce process.  Discussed outpatient procedure. Reviewed that  recovery is usually 1-2 days. Risks including infections, bleeding, and damage to surrounding organs reviewed. Recommend NPO prior to midnight and reviewed medication to take on day of surgery. Dicussed use of NSAIDS as needed for pain postoperatively.  Continue nightly Prometrium ,. Continue to hold estrogen products.  Preop checklist: Antibiotics: none DVT ppx: SCD Postop visit: 1 week Additional clearance: none  LATEX ALLERGY    Severe episode of recurrent major depressive disorder, without psychotic features (HCC) -     buPROPion  HCl ER (XL); Take 1 tablet (300 mg total) by mouth daily.  Dispense: 90 tablet; Refill: 3  Recommend increasing wellbutrin  to 300mg  every day, currently on 150mg  every day. Patient in agreement. New rx sent.  Romaine Closs, MD

## 2023-07-27 ENCOUNTER — Encounter: Payer: Self-pay | Admitting: Obstetrics and Gynecology

## 2023-08-01 NOTE — Assessment & Plan Note (Signed)
 Mary Jimenez

## 2023-08-01 NOTE — Assessment & Plan Note (Addendum)
 Diagnostic hysteroscopy, dilation and curettage, polypectomy scheduled 08/24/23. No significant changes since last visit, however mood symptoms worsening since off estrogen and with divorce process.  Discussed outpatient procedure. Reviewed that  recovery is usually 1-2 days. Risks including infections, bleeding, and damage to surrounding organs reviewed. Recommend NPO prior to midnight and reviewed medication to take on day of surgery. Dicussed use of NSAIDS as needed for pain postoperatively.  Continue nightly Prometrium ,. Continue to hold estrogen products.  Preop checklist: Antibiotics: none DVT ppx: SCD Postop visit: 1 week Additional clearance: none  LATEX ALLERGY

## 2023-08-07 ENCOUNTER — Encounter: Payer: Self-pay | Admitting: *Deleted

## 2023-08-09 ENCOUNTER — Other Ambulatory Visit: Payer: Self-pay | Admitting: Nurse Practitioner

## 2023-08-09 DIAGNOSIS — Z79890 Hormone replacement therapy (postmenopausal): Secondary | ICD-10-CM

## 2023-08-10 NOTE — Telephone Encounter (Signed)
 Med refill request: progesterone  200 mg Last OV: 07/25/23 pre-op Dr. Andrena Ke Last AEX: 06/13/22 Next AEX: none scheduled Next OV: 08/16/23 2 week med follow up Last MMG (if hormonal med) 2019 normal Refill authorized: progesterone  200 mg.   Please approve or deny as appropriate.

## 2023-08-16 ENCOUNTER — Encounter: Payer: Self-pay | Admitting: Obstetrics and Gynecology

## 2023-08-16 ENCOUNTER — Ambulatory Visit: Admitting: Obstetrics and Gynecology

## 2023-08-16 DIAGNOSIS — F419 Anxiety disorder, unspecified: Secondary | ICD-10-CM

## 2023-08-16 DIAGNOSIS — F332 Major depressive disorder, recurrent severe without psychotic features: Secondary | ICD-10-CM

## 2023-08-16 MED ORDER — BUSPIRONE HCL 5 MG PO TABS: 5.0000 mg | ORAL_TABLET | Freq: Two times a day (BID) | ORAL | 0 refills | Status: DC
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
## 2023-08-22 ENCOUNTER — Encounter (HOSPITAL_COMMUNITY): Payer: Self-pay | Admitting: Obstetrics and Gynecology

## 2023-08-22 NOTE — Progress Notes (Signed)
 Spoke w/ via phone for pre-op interview--- Abran Abrahams Lab needs dos---- NONE        Lab results------ COVID test -----patient states asymptomatic no test needed Arrive at -------0900 NPO after MN NO Solid Food.  Clear liquids from MN until---0800 Pre-Surgery Ensure or G2:  Med rec completed Medications to take morning of surgery ----- Albuterol -bring. Wellbutrin  and Buspar . Propranolol -PRN Diabetic medication -----  GLP1 agonist last dose: GLP1 instructions:  Patient instructed no nail polish to be worn day of surgery Patient instructed to bring photo id and insurance card day of surgery Patient aware to have Driver (ride ) / caregiver    for 24 hours after surgery - Daughter Shila Door Patient Special Instructions ----- shower with antibacterial soap. Pre-Op special Instructions -----  Patient verbalized understanding of instructions that were given at this phone interview. Patient denies chest pain, sob, fever, cough at the interview.

## 2023-08-24 ENCOUNTER — Encounter (HOSPITAL_COMMUNITY): Payer: Self-pay | Admitting: Obstetrics and Gynecology

## 2023-08-24 ENCOUNTER — Ambulatory Visit (HOSPITAL_COMMUNITY): Payer: Self-pay | Admitting: Anesthesiology

## 2023-08-24 ENCOUNTER — Other Ambulatory Visit: Payer: Self-pay

## 2023-08-24 ENCOUNTER — Ambulatory Visit (HOSPITAL_COMMUNITY)
Admission: RE | Admit: 2023-08-24 | Discharge: 2023-08-24 | Disposition: A | Discharge status: Home / Self Care | Attending: Obstetrics and Gynecology | Admitting: Obstetrics and Gynecology

## 2023-08-24 ENCOUNTER — Encounter (HOSPITAL_COMMUNITY)
Admission: RE | Disposition: A | Discharge status: Home / Self Care | Payer: Self-pay | Source: Home / Self Care | Attending: Obstetrics and Gynecology

## 2023-08-24 DIAGNOSIS — N950 Postmenopausal bleeding: Secondary | ICD-10-CM

## 2023-08-24 DIAGNOSIS — J45909 Unspecified asthma, uncomplicated: Secondary | ICD-10-CM | POA: Insufficient documentation

## 2023-08-24 DIAGNOSIS — N841 Polyp of cervix uteri: Secondary | ICD-10-CM | POA: Diagnosis not present

## 2023-08-24 DIAGNOSIS — Z87891 Personal history of nicotine dependence: Secondary | ICD-10-CM | POA: Insufficient documentation

## 2023-08-24 DIAGNOSIS — Z79899 Other long term (current) drug therapy: Secondary | ICD-10-CM | POA: Insufficient documentation

## 2023-08-24 DIAGNOSIS — Z79890 Hormone replacement therapy (postmenopausal): Secondary | ICD-10-CM | POA: Diagnosis not present

## 2023-08-24 DIAGNOSIS — F332 Major depressive disorder, recurrent severe without psychotic features: Secondary | ICD-10-CM | POA: Diagnosis not present

## 2023-08-24 DIAGNOSIS — Z91040 Latex allergy status: Secondary | ICD-10-CM | POA: Diagnosis not present

## 2023-08-24 DIAGNOSIS — F419 Anxiety disorder, unspecified: Secondary | ICD-10-CM | POA: Insufficient documentation

## 2023-08-24 DIAGNOSIS — R9389 Abnormal findings on diagnostic imaging of other specified body structures: Secondary | ICD-10-CM | POA: Diagnosis present

## 2023-08-24 DIAGNOSIS — N840 Polyp of corpus uteri: Secondary | ICD-10-CM | POA: Insufficient documentation

## 2023-08-24 HISTORY — PX: DILATATION & CURRETTAGE/HYSTEROSCOPY WITH RESECTOCOPE: SHX5572

## 2023-08-24 SURGERY — DILATATION & CURETTAGE/HYSTEROSCOPY WITH RESECTOCOPE
Anesthesia: General | Site: Vagina

## 2023-08-24 MED ORDER — OXYCODONE HCL 5 MG/5ML PO SOLN: 5.0000 mg | Freq: Once | ORAL | Status: AC | PRN

## 2023-08-24 MED ORDER — EPHEDRINE SULFATE-NACL 50-0.9 MG/10ML-% IV SOSY
PREFILLED_SYRINGE | INTRAVENOUS | Status: DC | PRN
  Administered 2023-08-24: 5 mg via INTRAVENOUS

## 2023-08-24 MED ORDER — ACETAMINOPHEN 500 MG PO TABS: 1000.0000 mg | ORAL_TABLET | ORAL | Status: AC

## 2023-08-24 MED ORDER — PROPOFOL 10 MG/ML IV BOLUS
INTRAVENOUS | Status: DC | PRN
  Administered 2023-08-24: 150 mg via INTRAVENOUS

## 2023-08-24 MED ORDER — AMISULPRIDE (ANTIEMETIC) 5 MG/2ML IV SOLN: 10.0000 mg | Freq: Once | INTRAVENOUS | Status: DC | PRN

## 2023-08-24 MED ORDER — OXYCODONE HCL 5 MG PO TABS
ORAL_TABLET | ORAL | Status: AC
  Filled 2023-08-24: qty 1

## 2023-08-24 MED ORDER — CELECOXIB 200 MG PO CAPS
ORAL_CAPSULE | ORAL | Status: AC
  Administered 2023-08-24: 400 mg via ORAL
  Filled 2023-08-24: qty 2

## 2023-08-24 MED ORDER — ORAL CARE MOUTH RINSE: 15.0000 mL | Freq: Once | OROMUCOSAL | Status: AC

## 2023-08-24 MED ORDER — CELECOXIB 200 MG PO CAPS: 400.0000 mg | ORAL_CAPSULE | ORAL | Status: AC

## 2023-08-24 MED ORDER — CHLORHEXIDINE GLUCONATE 0.12 % MT SOLN
OROMUCOSAL | Status: AC
  Administered 2023-08-24: 15 mL via OROMUCOSAL
  Filled 2023-08-24: qty 15

## 2023-08-24 MED ORDER — DEXAMETHASONE SODIUM PHOSPHATE 10 MG/ML IJ SOLN
INTRAMUSCULAR | Status: DC | PRN
  Administered 2023-08-24: 5 mg via INTRAVENOUS

## 2023-08-24 MED ORDER — MIDAZOLAM HCL 2 MG/2ML IJ SOLN
INTRAMUSCULAR | Status: DC | PRN
  Administered 2023-08-24: 2 mg via INTRAVENOUS

## 2023-08-24 MED ORDER — ACETAMINOPHEN 500 MG PO TABS
ORAL_TABLET | ORAL | Status: AC
  Administered 2023-08-24: 1000 mg via ORAL
  Filled 2023-08-24: qty 2

## 2023-08-24 MED ORDER — OXYCODONE HCL 5 MG PO TABS
5.0000 mg | ORAL_TABLET | Freq: Once | ORAL | Status: AC | PRN
  Administered 2023-08-24: 5 mg via ORAL

## 2023-08-24 MED ORDER — MIDAZOLAM HCL 2 MG/2ML IJ SOLN
INTRAMUSCULAR | Status: AC
  Filled 2023-08-24: qty 2

## 2023-08-24 MED ORDER — LACTATED RINGERS IV SOLN: INTRAVENOUS | Status: DC

## 2023-08-24 MED ORDER — FENTANYL CITRATE (PF) 100 MCG/2ML IJ SOLN
25.0000 ug | INTRAMUSCULAR | Status: DC | PRN
  Administered 2023-08-24: 25 ug via INTRAVENOUS

## 2023-08-24 MED ORDER — FENTANYL CITRATE (PF) 100 MCG/2ML IJ SOLN
INTRAMUSCULAR | Status: AC
  Filled 2023-08-24: qty 2

## 2023-08-24 MED ORDER — CHLORHEXIDINE GLUCONATE 0.12 % MT SOLN: 15.0000 mL | Freq: Once | OROMUCOSAL | Status: AC

## 2023-08-24 MED ORDER — LIDOCAINE 2% (20 MG/ML) 5 ML SYRINGE
INTRAMUSCULAR | Status: DC | PRN
  Administered 2023-08-24: 60 mg via INTRAVENOUS

## 2023-08-24 MED ORDER — FENTANYL CITRATE (PF) 250 MCG/5ML IJ SOLN
INTRAMUSCULAR | Status: DC | PRN
  Administered 2023-08-24: 50 ug via INTRAVENOUS

## 2023-08-24 MED ORDER — LIDOCAINE HCL (PF) 1 % IJ SOLN
INTRAMUSCULAR | Status: DC | PRN
  Administered 2023-08-24: 5 mL

## 2023-08-24 MED ORDER — FENTANYL CITRATE (PF) 250 MCG/5ML IJ SOLN
INTRAMUSCULAR | Status: AC
  Filled 2023-08-24: qty 5

## 2023-08-24 MED ORDER — PHENYLEPHRINE 80 MCG/ML (10ML) SYRINGE FOR IV PUSH (FOR BLOOD PRESSURE SUPPORT)
PREFILLED_SYRINGE | INTRAVENOUS | Status: DC | PRN
  Administered 2023-08-24: 80 ug via INTRAVENOUS

## 2023-08-24 MED ORDER — ONDANSETRON HCL 4 MG/2ML IJ SOLN
INTRAMUSCULAR | Status: DC | PRN
  Administered 2023-08-24: 4 mg via INTRAVENOUS

## 2023-08-24 MED ORDER — SODIUM CHLORIDE 0.9 % IR SOLN
Status: DC | PRN
  Administered 2023-08-24: 3000 mL

## 2023-08-24 SURGICAL SUPPLY — 15 items
DEVICE MYOSURE LITE (MISCELLANEOUS) IMPLANT
GLOVE BIOGEL PI IND STRL 6 (GLOVE) IMPLANT
GLOVE BIOGEL PI IND STRL 7.0 (GLOVE) ×1 IMPLANT
GLOVE SURG SS PI 7.0 STRL IVOR (GLOVE) IMPLANT
GOWN STRL REUS W/ TWL LRG LVL3 (GOWN DISPOSABLE) IMPLANT
GOWN STRL REUS W/ TWL XL LVL3 (GOWN DISPOSABLE) ×1 IMPLANT
HIBICLENS CHG 4% 4OZ BTL (MISCELLANEOUS) ×1 IMPLANT
KIT PROCEDURE FLUENT (KITS) ×1 IMPLANT
KIT TURNOVER KIT B (KITS) ×1 IMPLANT
PACK VAGINAL MINOR WOMEN LF (CUSTOM PROCEDURE TRAY) ×1 IMPLANT
PAD OB MATERNITY 11 LF (PERSONAL CARE ITEMS) ×1 IMPLANT
PAD PREP 24X48 CUFFED NSTRL (MISCELLANEOUS) ×1 IMPLANT
SEAL ROD LENS SCOPE MYOSURE (ABLATOR) ×1 IMPLANT
SOL .9 NS 3000ML IRR UROMATIC (IV SOLUTION) ×1 IMPLANT
TOWEL OR 17X24 6PK STRL BLUE (TOWEL DISPOSABLE) ×1 IMPLANT

## 2023-08-24 NOTE — Interval H&P Note (Signed)
 Date of Initial H&P: 07/25/23  History reviewed, patient examined, no change in status, stable for surgery.

## 2023-08-24 NOTE — Op Note (Signed)
 08/24/2023 Gregoria Leas [id number]  OPERATIVE REPORT  Preop Diagnosis: Pre-Op Diagnosis Codes:     * Thickened endometrium [R93.89]   Post operative diagnosis: same  Procedure: Diagnostic hysteroscopy, dilation and curettage, polypectomy via myosure  Surgeon: Romaine Closs, MD Assistant: none  Anesthesia: MAC Fluids: please see anesthesia report Fluid deficit: 135cc  Complications: None  Findings: Cervical polyp noted. Uterine cavity measuring 8cm with multiple uterine polyps. Both ostia seen.  Estimated blood loss: Minimal  Specimens:  ID Type Source Tests Collected by Time Destination  1 : cervical polyp Tissue PATH Gyn biopsy SURGICAL PATHOLOGY Romaine Closs, MD 08/24/2023 1144   2 : endometrial polyps and curettings Tissue PATH Gyn biopsy SURGICAL PATHOLOGY Romaine Closs, MD 08/24/2023 1156     Disposition of specimen: Pathology    Procedure: Patient was taken to the OR where she was placed in dorsal lithotomy in stirrups. She voided prior to transport. SCDs were in place.  The patient was prepped and draped in the usual sterile fashion. An adequate timeout was obtained and everyone agreed. A bivalve speculum was placed inside the vagina and the cervix visualized. The cervix was grasped anteriorly with a single-tooth tenaculum. Paracervical block was performed with 1% lidocaine. Cervical polyp was removed with forceps. The uterus sounded to 8 cm. Sequential cervical dilation was done to 71fr, and the myosure hysteroscope was introduced into the uterine cavity. The cervix and endometrial lining appeared normal both ostia were seen. Findings as noted. Uterine polyps were seen and removed.  This was also used to sample the entire cavity.  A sharp curettage was then performed to ensure complete sampling of the cavity and was sent to pathology. All instrument, sponge and lap counts were correct x2. The patient was awakened taken to recovery room in stable  condition.  Romaine Closs MD 08/24/2023 12:06 PM

## 2023-08-24 NOTE — Anesthesia Procedure Notes (Signed)
 Procedure Name: LMA Insertion Date/Time: 08/24/2023 11:27 AM  Performed by: Viki Graver, CRNAPre-anesthesia Checklist: Patient identified, Emergency Drugs available, Suction available and Patient being monitored Patient Re-evaluated:Patient Re-evaluated prior to induction Oxygen Delivery Method: Circle System Utilized Preoxygenation: Pre-oxygenation with 100% oxygen Induction Type: IV induction LMA: LMA inserted LMA Size: 4.0 Number of attempts: 1 Airway Equipment and Method: Bite block Placement Confirmation: positive ETCO2 Tube secured with: Tape Dental Injury: Teeth and Oropharynx as per pre-operative assessment  Comments: Performed by Gae Jointer, SRNA

## 2023-08-24 NOTE — Transfer of Care (Signed)
 Immediate Anesthesia Transfer of Care Note  Patient: Mary Jimenez  Procedure(s) Performed: DILATATION & CURETTAGE/DIAGNOSTIC HYSTEROSCOPY, POLYPECTOMY (Vagina )  Patient Location: PACU  Anesthesia Type:General  Level of Consciousness: sedated and responds to stimulation  Airway & Oxygen Therapy: Patient Spontanous Breathing and Patient connected to face mask  Post-op Assessment: Report given to RN and Post -op Vital signs reviewed and stable  Post vital signs: Reviewed and stable  Last Vitals:  Vitals Value Taken Time  BP 129/80 08/24/23 12:12  Temp 97.8 06/20250 1214  Pulse 74 08/24/23 12:14  Resp 11 08/24/23 12:14  SpO2 100 % 08/24/23 12:14  Vitals shown include unfiled device data.  Last Pain:  Vitals:   08/24/23 0917  TempSrc: Oral  PainSc: 0-No pain      Patients Stated Pain Goal: 4 (08/24/23 0917)  Complications: No notable events documented.

## 2023-08-24 NOTE — Discharge Instructions (Signed)
   D & C Home care Instructions:   Personal hygiene:  Used sanitary napkins for vaginal drainage not tampons. Shower or tub bathe the day after your procedure. No douching until bleeding stops. Always wipe from front to back after  Elimination.  Activity: Do not drive or operate any equipment today. The effects of the anesthesia are still present and drowsiness may result. Rest today, not necessarily flat bed rest, just take it easy. You may resume your normal activity in one to 2 days.  Sexual activity: No intercourse for one week or as indicated by your physician  Diet: Eat a light diet as desired this evening. You may resume a regular diet tomorrow.  Return to work: One to 2 days.  General Expectations of your surgery: Vaginal bleeding should be no heavier than a normal period. Spotting may continue up to 10 days. Mild cramps may continue for a couple of days. You may have a regular period in 2-6 weeks.  Unexpected observations call your doctor if these occur: persistent or heavy bleeding. Severe abdominal cramping or pain. Elevation of temperature greater than 100F.  Call for an appointment in one week.   Post Anesthesia Home Care Instructions  Activity: Get plenty of rest for the remainder of the day. A responsible individual must stay with you for 24 hours following the procedure.  For the next 24 hours, DO NOT: -Drive a car -Operate machinery -Drink alcoholic beverages -Take any medication unless instructed by your physician -Make any legal decisions or sign important papers.  Meals: Start with liquid foods such as gelatin or soup. Progress to regular foods as tolerated. Avoid greasy, spicy, heavy foods. If nausea and/or vomiting occur, drink only clear liquids until the nausea and/or vomiting subsides. Call your physician if vomiting continues.  Special Instructions/Symptoms: Your throat may feel dry or sore from the anesthesia or the breathing tube placed in your throat  during surgery. If this causes discomfort, gargle with warm salt water. The discomfort should disappear within 24 hours.   

## 2023-08-24 NOTE — Anesthesia Preprocedure Evaluation (Addendum)
 Anesthesia Evaluation  Patient identified by MRN, date of birth, ID band Patient awake    Reviewed: Allergy & Precautions, NPO status , Patient's Chart, lab work & pertinent test results, reviewed documented beta blocker date and time   Airway Mallampati: III  TM Distance: >3 FB Neck ROM: Full    Dental no notable dental hx. (+) Teeth Intact, Dental Advisory Given   Pulmonary asthma , former smoker   Pulmonary exam normal breath sounds clear to auscultation       Cardiovascular negative cardio ROS Normal cardiovascular exam Rhythm:Regular Rate:Normal     Neuro/Psych  PSYCHIATRIC DISORDERS Anxiety Depression    negative neurological ROS     GI/Hepatic negative GI ROS, Neg liver ROS,,,  Endo/Other  negative endocrine ROS    Renal/GU negative Renal ROS  negative genitourinary   Musculoskeletal negative musculoskeletal ROS (+)    Abdominal   Peds  Hematology negative hematology ROS (+)   Anesthesia Other Findings   Reproductive/Obstetrics                             Anesthesia Physical Anesthesia Plan  ASA: 2  Anesthesia Plan: General   Post-op Pain Management: Tylenol PO (pre-op)*   Induction: Intravenous  PONV Risk Score and Plan: 3 and Ondansetron, Dexamethasone and Midazolam  Airway Management Planned: LMA  Additional Equipment:   Intra-op Plan:   Post-operative Plan: Extubation in OR  Informed Consent: I have reviewed the patients History and Physical, chart, labs and discussed the procedure including the risks, benefits and alternatives for the proposed anesthesia with the patient or authorized representative who has indicated his/her understanding and acceptance.     Dental advisory given  Plan Discussed with: CRNA  Anesthesia Plan Comments:        Anesthesia Quick Evaluation

## 2023-08-27 ENCOUNTER — Encounter (HOSPITAL_COMMUNITY): Payer: Self-pay | Admitting: Obstetrics and Gynecology

## 2023-08-27 ENCOUNTER — Ambulatory Visit: Payer: Self-pay | Admitting: Obstetrics and Gynecology

## 2023-08-27 DIAGNOSIS — Z79890 Hormone replacement therapy (postmenopausal): Secondary | ICD-10-CM

## 2023-08-27 LAB — SURGICAL PATHOLOGY

## 2023-08-28 NOTE — Anesthesia Postprocedure Evaluation (Signed)
 Anesthesia Post Note  Patient: Mary Jimenez  Procedure(s) Performed: DILATATION & CURETTAGE/DIAGNOSTIC HYSTEROSCOPY, POLYPECTOMY (Vagina )     Patient location during evaluation: PACU Anesthesia Type: General Level of consciousness: awake and alert Pain management: pain level controlled Vital Signs Assessment: post-procedure vital signs reviewed and stable Respiratory status: spontaneous breathing, nonlabored ventilation, respiratory function stable and patient connected to nasal cannula oxygen Cardiovascular status: blood pressure returned to baseline and stable Postop Assessment: no apparent nausea or vomiting Anesthetic complications: no  No notable events documented.  Last Vitals:  Vitals:   08/24/23 1234 08/24/23 1306  BP: 132/79   Pulse:  72  Resp:  18  Temp:  36.6 C  SpO2:  97%    Last Pain:  Vitals:   08/24/23 1234  TempSrc:   PainSc: 6    Pain Goal: Patients Stated Pain Goal: 4 (08/24/23 0917)                 Marien L Jariyah Hackley

## 2023-08-29 MED ORDER — ESTRADIOL 0.0375 MG/24HR TD PTTW
1.0000 | MEDICATED_PATCH | TRANSDERMAL | 0 refills | Status: DC
Start: 2023-08-30 — End: 2023-10-29

## 2023-08-29 NOTE — Telephone Encounter (Signed)
 Estradiol  0.0375 Rx previously paused for Bx results. Rx resumed 08/29/23.   Call placed to [facility], spoke with Callie.  No refills on file for estradiol  0.0375 mg patch.  Has refill of estradiol  vaginal cream, they are working on filling now.   Last AEX 06/13/22-TW Next AEX not scheduled. Post op scheduled 09/06/23  Dr. Dallie -please advise on Rx refill

## 2023-09-06 ENCOUNTER — Ambulatory Visit: Admitting: Obstetrics and Gynecology

## 2023-09-06 ENCOUNTER — Encounter: Payer: Self-pay | Admitting: Obstetrics and Gynecology

## 2023-09-06 VITALS — BP 122/78 | HR 83 | Temp 98.2°F | Wt 170.0 lb

## 2023-09-06 DIAGNOSIS — N840 Polyp of corpus uteri: Secondary | ICD-10-CM

## 2023-09-06 DIAGNOSIS — Z79890 Hormone replacement therapy (postmenopausal): Secondary | ICD-10-CM

## 2023-09-06 DIAGNOSIS — Z09 Encounter for follow-up examination after completed treatment for conditions other than malignant neoplasm: Secondary | ICD-10-CM

## 2023-09-06 DIAGNOSIS — N950 Postmenopausal bleeding: Secondary | ICD-10-CM

## 2023-09-06 NOTE — Progress Notes (Signed)
 58 y.o. [id number] female on HRT with postmenopausal bleeding, thickened endometrium, asthma, anxiety and depression here for postop visit, now 2 weeks s/p diagnostic hysteroscopy, D&C on 08/24/2023. Legally Separated- undergoing divorce. Sees T.Prentiss, NP for GYN care.  Patient's last menstrual period was 09/11/2019.   Pt states she is doing well. Denies N/V, abdominal pain, VB.   08/24/23 pathology: A. CERVIX, POLYPECTOMY:  - Benign endocervical polyp   B. ENDOMETRIUM, CURETTAGE AND POLYPS:  - Benign endometrial polyp  - Fragments of benign endometrium  - Negative for hyperplasia or malignancy   GYN HISTORY: No significant history  OB History  Gravida Para Term Preterm AB Living  3 2 2  1 2   SAB IAB Ectopic Multiple Live Births   1       # Outcome Date GA Lbr Len/2nd Weight Sex Type Anes PTL Lv  3 Term           2 Term           1 IAB             Past Medical History:  Diagnosis Date   Anal fissure    Anemia    Anxiety    Asthma    Depression    History of chicken pox    History of colon polyps    Hx of sessile serrated colonic polyp 09/17/2017   12 mm ssp    Past Surgical History:  Procedure Laterality Date   CERVICAL POLYPECTOMY     DILATATION & CURRETTAGE/HYSTEROSCOPY WITH RESECTOCOPE N/A 08/24/2023   Procedure: DILATATION & CURETTAGE/DIAGNOSTIC HYSTEROSCOPY, POLYPECTOMY;  Surgeon: Dallie Vera GAILS, MD;  Location: [facility] OR;  Service: Gynecology;  Laterality: N/A;  MYOSURE   HEMANGIOMA EXCISION  05/2012   liver hemangeoma removed and partial liver   TONSILLECTOMY AND ADENOIDECTOMY      Current Outpatient Medications on File Prior to Visit  Medication Sig Dispense Refill   albuterol  (VENTOLIN  HFA) 108 (90 Base) MCG/ACT inhaler Inhale into the lungs.     buPROPion  (WELLBUTRIN  XL) 300 MG 24 hr tablet Take 1 tablet (300 mg total) by mouth daily. 90 tablet 3   busPIRone  (BUSPAR ) 5 MG tablet Take 1 tablet (5 mg total) by mouth 2 (two) times daily. 180 tablet 0    cetirizine (ZYRTEC) 10 MG tablet Take 10 mg by mouth daily.     cyanocobalamin  (VITAMIN B12) 500 MCG tablet Take by mouth.     estradiol  (ESTRACE ) 0.1 MG/GM vaginal cream Place 1 Applicatorful vaginally 2 (two) times a week. Insert 2 grams twice weekly 42.5 g 2   estradiol  (VIVELLE -DOT) 0.0375 MG/24HR Place 1 patch onto the skin 2 (two) times a week. 24 patch 0   Multiple Vitamin (MULTIVITAMIN) capsule Take 1 capsule by mouth daily.     N-ACETYL CYSTEINE PO Take by mouth.     Omega-3 Fatty Acids (FISH OIL OMEGA-3 PO) Take by mouth daily.     progesterone  (PROMETRIUM ) 200 MG capsule TAKE 1 CAPSULE BY MOUTH DAILY 30 capsule 0   propranolol  (INDERAL ) 10 MG tablet Take 1 tablet (10 mg total) by mouth 2 (two) times daily as needed. 60 tablet 0   triamcinolone  cream (KENALOG ) 0.1 % Apply 1 application  topically 2 (two) times daily as needed. 30 g 1   No current facility-administered medications on file prior to visit.    Allergies  Allergen Reactions   Pseudoephedrine Other (See Comments)    Actifed caused hallucinations as a child   Latex  Itching      PE Today's Vitals   09/06/23 1424  BP: 122/78  Pulse: 83  Temp: 98.2 F (36.8 C)  TempSrc: Oral  SpO2: 98%  Weight: 170 lb (77.1 kg)    Body mass index is 26.63 kg/m.  Physical Exam Vitals reviewed.  Constitutional:      General: She is not in acute distress.    Appearance: Normal appearance.  HENT:     Head: Normocephalic and atraumatic.     Nose: Nose normal.  Eyes:     Extraocular Movements: Extraocular movements intact.     Conjunctiva/sclera: Conjunctivae normal.  Pulmonary:     Effort: Pulmonary effort is normal.  Musculoskeletal:        General: Normal range of motion.     Cervical back: Normal range of motion.  Neurological:     General: No focal deficit present.     Mental Status: She is alert.  Psychiatric:        Mood and Affect: Mood normal.        Behavior: Behavior normal.    Assessment and Plan:         Postop check Endometrial polyp Postmenopausal bleeding Normal postop exam.  Pathology reviewed with patient. Cleared to return to work. All questions answered.   Postmenopausal hormone therapy Continue HRT  RTO for annual exam in 2 months with T. Prentiss, NP.  Vera LULLA Pa, MD

## 2023-09-13 MED ORDER — PROGESTERONE 200 MG PO CAPS: 200.0000 mg | ORAL_CAPSULE | Freq: Every day | ORAL | 0 refills | Status: DC

## 2023-09-13 NOTE — Addendum Note (Signed)
 Addended by: DALLIE BOLLARD V on: 09/13/2023 05:00 PM   Modules accepted: Orders

## 2023-09-13 NOTE — Addendum Note (Signed)
 Addended by: BRUTUS KATE SAILOR on: 09/13/2023 04:07 PM   Modules accepted: Orders

## 2023-09-13 NOTE — Telephone Encounter (Signed)
 Pharmacy updated.   Rx pended for progesterone  200 mg cap.   Last RX sent 08/13/23, #30/0RF

## 2023-10-25 NOTE — Progress Notes (Signed)
 58 y.o. [id number] postmenopausal female on HRT with anxiety and depression here for annual exam. Legally Separated- undergoing divorce.  Patient's last menstrual period was 09/11/2019.   She reports vaginal bleeding for one day a week ago, dark bleeding. Dull pelvic pain inside the top of vagina. Abdomen is tender to touch for ~1 week ago. +Bloating. Concerns with BP being elevated in right arm vs left arm.  Unsure if estrogen is helping, feels like night sweats, brain fog, and fatigue have returned since restarting estradiol  patch end of June following hysteroscopy. Felt she was doing better just on prometrium .  Mood symptoms are better on wellbutrin  300mg  every day and buspar  BID.  Urine sample provided: No  Abnormal bleeding: as noted Pelvic discharge or pain: as noted Breast mass, nipple discharge or skin changes : none  Sexually active: Not currently Last PAP:     Component Value Date/Time   DIAGPAP  05/12/2021 1241    - Negative for intraepithelial lesion or malignancy (NILM)   HPVHIGH Negative 05/12/2021 1241   ADEQPAP  05/12/2021 1241    Satisfactory for evaluation; transformation zone component PRESENT.   Last mammogram: never, has out of state insurance> referred to [facility] Last colonoscopy: 07/21/22 5 yr recall  Exercising: Yes, walk 3 days a week Smoker: No, former  [employer] from 10/29/2023 in [facility]  PHQ-2 Total Score 1    Flowsheet Row Office Visit from 10/29/2023 in [facility]  PHQ-9 Total Score 8     GYN HISTORY: diagnostic hysteroscopy, D&C on 08/24/2023 > benign polyps Traumatic foot fracture, 2024  OB History  Gravida Para Term Preterm AB Living  3 2 2  1 2   SAB IAB Ectopic Multiple Live Births   1   2    # Outcome Date GA Lbr Len/2nd Weight Sex Type Anes PTL Lv  3 Term      Vag-Spont   LIV  2 Term      Vag-Spont   LIV  1 IAB            Past Medical History:   Diagnosis Date   Anal fissure    Anemia    Anxiety    Asthma    Depression    History of chicken pox    History of colon polyps    Hx of sessile serrated colonic polyp 09/17/2017   12 mm ssp   Past Surgical History:  Procedure Laterality Date   CERVICAL POLYPECTOMY     DILATATION & CURRETTAGE/HYSTEROSCOPY WITH RESECTOCOPE N/A 08/24/2023   Procedure: DILATATION & CURETTAGE/DIAGNOSTIC HYSTEROSCOPY, POLYPECTOMY;  Surgeon: Dallie Mary GAILS, MD;  Location: [facility] OR;  Service: Gynecology;  Laterality: N/A;  MYOSURE   HEMANGIOMA EXCISION  05/2012   liver hemangeoma removed and partial liver   TONSILLECTOMY AND ADENOIDECTOMY     Current Outpatient Medications on File Prior to Visit  Medication Sig Dispense Refill   albuterol  (VENTOLIN  HFA) 108 (90 Base) MCG/ACT inhaler Inhale into the lungs.     cetirizine (ZYRTEC) 10 MG tablet Take 10 mg by mouth daily.     cyanocobalamin  (VITAMIN B12) 500 MCG tablet Take by mouth.     Multiple Vitamin (MULTIVITAMIN) capsule Take 1 capsule by mouth daily.     N-ACETYL CYSTEINE PO Take by mouth.     Omega-3 Fatty Acids (FISH OIL OMEGA-3 PO) Take by mouth daily.     propranolol  (INDERAL ) 10 MG tablet Take 1 tablet (  10 mg total) by mouth 2 (two) times daily as needed. 60 tablet 0   triamcinolone  cream (KENALOG ) 0.1 % Apply 1 application  topically 2 (two) times daily as needed. 30 g 1   No current facility-administered medications on file prior to visit.   Social History   Socioeconomic History   Marital status: Legally Separated    Spouse name: Not on file   Number of children: 2   Years of education: Not on file   Highest education level: Some college, no degree  Occupational History   Not on file  Tobacco Use   Smoking status: Former    Current packs/day: 0.00    Average packs/day: 1 pack/day for 7.0 years (7.0 ttl pk-yrs)    Types: Cigarettes    Start date: 25    Quit date: 63    Years since quitting: 30.6   Smokeless tobacco: Never    Tobacco comments:    smoked off and on  Vaping Use   Vaping status: Never Used  Substance and Sexual Activity   Alcohol use: Not Currently    Comment: occasional beer   Drug use: Never   Sexual activity: Not Currently    Birth control/protection: Post-menopausal    Comment: 1st intercourse 58 yo-Fewer than 5 partners  Other Topics Concern   Not on file  Social History Narrative   Not on file   Social Drivers of Health   Financial Resource Strain: High Risk (02/06/2023)   Overall Financial Resource Strain (CARDIA)    Difficulty of Paying Living Expenses: Very hard  Food Insecurity: Food Insecurity Present (02/06/2023)   Hunger Vital Sign    Worried About Running Out of Food in the Last Year: Sometimes true    Ran Out of Food in the Last Year: Sometimes true  Transportation Needs: No Transportation Needs (02/06/2023)   PRAPARE - Administrator, Civil Service (Medical): No    Lack of Transportation (Non-Medical): No  Physical Activity: Sufficiently Active (02/06/2023)   Exercise Vital Sign    Days of Exercise per Week: 4 days    Minutes of Exercise per Session: 40 min  Stress: Stress Concern Present (02/06/2023)   [employer] of Occupational Health - Occupational Stress Questionnaire    Feeling of Stress : Very much  Social Connections: Socially Isolated (02/06/2023)   Social Connection and Isolation Panel    Frequency of Communication with Friends and Family: More than three times a week    Frequency of Social Gatherings with Friends and Family: More than three times a week    Attends Religious Services: Never    Database administrator or Organizations: No    Attends Engineer, structural: Not on file    Marital Status: Separated  Intimate Partner Violence: Not on file   Family History  Problem Relation Age of Onset   Arthritis Mother    Asthma Mother    Depression Mother    Hypertension Mother    Kidney failure Father    Asthma Sister     Hypertension Sister    Miscarriages / Stillbirths Sister    Alcohol abuse Maternal Grandmother    Lung cancer Maternal Grandmother    Cancer Maternal Grandfather        ? type   Uterine cancer Maternal Aunt    Allergies  Allergen Reactions   Pseudoephedrine Other (See Comments)    Actifed caused hallucinations as a child   Latex Itching  PE Today's Vitals   10/29/23 1529 10/29/23 1548  BP: 104/70 136/64  Pulse: 76   Temp: 97.9 F (36.6 C)   TempSrc: Oral   SpO2: 97%   Weight: 174 lb (78.9 kg)   Height: 5' 7 (1.702 m)    Body mass index is 27.25 kg/m.  Physical Exam Vitals reviewed. Exam conducted with a chaperone present.  Constitutional:      General: She is not in acute distress.    Appearance: Normal appearance.  HENT:     Head: Normocephalic and atraumatic.     Nose: Nose normal.  Eyes:     Extraocular Movements: Extraocular movements intact.     Conjunctiva/sclera: Conjunctivae normal.  Neck:     Thyroid : No thyroid  mass, thyromegaly or thyroid  tenderness.  Pulmonary:     Effort: Pulmonary effort is normal.  Chest:     Chest wall: No mass or tenderness.  Breasts:    Right: Normal. No swelling, mass, nipple discharge, skin change or tenderness.     Left: Normal. No swelling, mass, nipple discharge, skin change or tenderness.  Abdominal:     General: There is no distension.     Palpations: Abdomen is soft.     Tenderness: There is no abdominal tenderness.  Genitourinary:    General: Normal vulva.     Exam position: Lithotomy position.     Urethra: No prolapse.     Vagina: Normal. No vaginal discharge or bleeding.     Cervix: Normal. No lesion.     Uterus: Normal. Not enlarged and not tender.      Adnexa: Right adnexa normal and left adnexa normal.  Musculoskeletal:        General: Normal range of motion.     Cervical back: Normal range of motion.  Lymphadenopathy:     Upper Body:     Right upper body: No axillary adenopathy.     Left upper  body: No axillary adenopathy.     Lower Body: No right inguinal adenopathy. No left inguinal adenopathy.  Skin:    General: Skin is warm and dry.  Neurological:     General: No focal deficit present.     Mental Status: She is alert.  Psychiatric:        Mood and Affect: Mood normal.        Behavior: Behavior normal.       Assessment and Plan:        Well woman exam with routine gynecological exam Assessment & Plan: Cervical cancer screening performed according to ASCCP guidelines. Encouraged annual mammogram screening > past due, information for [facility] and mobile MMG provided Colonoscopy UTD DXA N/A Labs and immunizations with her primary Encouraged safe sexual practices as indicated Encouraged healthy lifestyle practices with diet and exercise For patients under 50-70yo, I recommend 1200mg  calcium daily and 600IU of vitamin D daily.    Hot flashes due to menopause  Postmenopausal hormone therapy Assessment & Plan: Patient reports typical sx during first 3 months of HRT Continue to monitor until beginning of October, recent benign polyps on hysteroscopy If persistent VB, RTO for f/u If not feeling better, discontinue estradiol  patch, continue prometrium  and start estroven Would recommend MMG prior to increasing estradiol  any further Patient in agreement  Orders: -     Estradiol ; Place 1 patch onto the skin 2 (two) times a week.  Dispense: 24 patch; Refill: 3 -     Progesterone ; Take 1 capsule (200 mg total) by mouth daily.  Dispense: 90 capsule; Refill: 3  Severe episode of recurrent major depressive disorder, without psychotic features (HCC) -     buPROPion  HCl ER (XL); Take 1 tablet (300 mg total) by mouth daily.  Dispense: 90 tablet; Refill: 3 -     busPIRone  HCl; Take 1 tablet (5 mg total) by mouth 2 (two) times daily.  Dispense: 180 tablet; Refill: 3  Anxiety disorder, unspecified type -     busPIRone  HCl; Take 1 tablet (5 mg total) by mouth 2 (two) times daily.   Dispense: 180 tablet; Refill: 3  Screening for depression  Menopausal vaginal dryness -     Estradiol ; Apply 0.5g to vulva twice a week. Do not use applicator.  Dispense: 42.5 g; Refill: 1   Mary LULLA Pa, MD

## 2023-10-29 ENCOUNTER — Encounter: Payer: Self-pay | Admitting: Obstetrics and Gynecology

## 2023-10-29 ENCOUNTER — Ambulatory Visit (INDEPENDENT_AMBULATORY_CARE_PROVIDER_SITE_OTHER): Admitting: Obstetrics and Gynecology

## 2023-10-29 VITALS — BP 136/64 | HR 76 | Temp 97.9°F | Ht 67.0 in | Wt 174.0 lb

## 2023-10-29 DIAGNOSIS — Z79890 Hormone replacement therapy (postmenopausal): Secondary | ICD-10-CM | POA: Insufficient documentation

## 2023-10-29 DIAGNOSIS — F332 Major depressive disorder, recurrent severe without psychotic features: Secondary | ICD-10-CM

## 2023-10-29 DIAGNOSIS — Z1331 Encounter for screening for depression: Secondary | ICD-10-CM

## 2023-10-29 DIAGNOSIS — Z01419 Encounter for gynecological examination (general) (routine) without abnormal findings: Secondary | ICD-10-CM | POA: Insufficient documentation

## 2023-10-29 DIAGNOSIS — N951 Menopausal and female climacteric states: Secondary | ICD-10-CM | POA: Diagnosis not present

## 2023-10-29 DIAGNOSIS — F419 Anxiety disorder, unspecified: Secondary | ICD-10-CM

## 2023-10-29 MED ORDER — ESTRADIOL 0.1 MG/GM VA CREA: TOPICAL_CREAM | VAGINAL | 1 refills | Status: AC

## 2023-10-29 MED ORDER — BUSPIRONE HCL 5 MG PO TABS: 5.0000 mg | ORAL_TABLET | Freq: Two times a day (BID) | ORAL | 3 refills | Status: AC

## 2023-10-29 MED ORDER — BUPROPION HCL ER (XL) 300 MG PO TB24: 300.0000 mg | ORAL_TABLET | Freq: Every day | ORAL | 3 refills | Status: AC

## 2023-10-29 MED ORDER — ESTRADIOL 0.0375 MG/24HR TD PTTW
1.0000 | MEDICATED_PATCH | TRANSDERMAL | 3 refills | Status: DC
Start: 2023-10-29 — End: 2023-12-10

## 2023-10-29 MED ORDER — PROGESTERONE 200 MG PO CAPS
200.0000 mg | ORAL_CAPSULE | Freq: Every day | ORAL | 3 refills | Status: AC
  Filled 2024-02-25: qty 90, 90d supply, fill #0
  Filled 2024-03-10: qty 30, 30d supply, fill #0
  Filled 2024-03-11: qty 90, 90d supply, fill #0

## 2023-10-29 NOTE — Assessment & Plan Note (Signed)
 Patient reports typical sx during first 3 months of HRT Continue to monitor until beginning of October, recent benign polyps on hysteroscopy If persistent VB, RTO for f/u If not feeling better, discontinue estradiol  patch, continue prometrium  and start estroven Would recommend MMG prior to increasing estradiol  any further Patient in agreement

## 2023-10-29 NOTE — Patient Instructions (Signed)
 For patients under 50-58yo, I recommend 1200mg  calcium  daily and 600IU of vitamin D daily. For patients over 58yo, I recommend 1200mg  calcium  daily and 800IU of vitamin D daily.  Health Maintenance, Female Adopting a healthy lifestyle and getting preventive care are important in promoting health and wellness. Ask your health care provider about: The right schedule for you to have regular tests and exams. Things you can do on your own to prevent diseases and keep yourself healthy. What should I know about diet, weight, and exercise? Eat a healthy diet  Eat a diet that includes plenty of vegetables, fruits, low-fat dairy products, and lean protein. Do not eat a lot of foods that are high in solid fats, added sugars, or sodium. Maintain a healthy weight Body mass index (BMI) is used to identify weight problems. It estimates body fat based on height and weight. Your health care provider can help determine your BMI and help you achieve or maintain a healthy weight. Get regular exercise Get regular exercise. This is one of the most important things you can do for your health. Most adults should: Exercise for at least 150 minutes each week. The exercise should increase your heart rate and make you sweat (moderate-intensity exercise). Do strengthening exercises at least twice a week. This is in addition to the moderate-intensity exercise. Spend less time sitting. Even light physical activity can be beneficial. Watch cholesterol and blood lipids Have your blood tested for lipids and cholesterol at 58 years of age, then have this test every 5 years. Have your cholesterol levels checked more often if: Your lipid or cholesterol levels are high. You are older than 58 years of age. You are at high risk for heart disease. What should I know about cancer screening? Depending on your health history and family history, you may need to have cancer screening at various ages. This may include screening  for: Breast cancer. Cervical cancer. Colorectal cancer. Skin cancer. Lung cancer. What should I know about heart disease, diabetes, and high blood pressure? Blood pressure and heart disease High blood pressure causes heart disease and increases the risk of stroke. This is more likely to develop in people who have high blood pressure readings or are overweight. Have your blood pressure checked: Every 3-5 years if you are 25-57 years of age. Every year if you are 24 years old or older. Diabetes Have regular diabetes screenings. This checks your fasting blood sugar level. Have the screening done: Once every three years after age 62 if you are at a normal weight and have a low risk for diabetes. More often and at a younger age if you are overweight or have a high risk for diabetes. What should I know about preventing infection? Hepatitis B If you have a higher risk for hepatitis B, you should be screened for this virus. Talk with your health care provider to find out if you are at risk for hepatitis B infection. Hepatitis C Testing is recommended for: Everyone born from [birth year] through 1965. Anyone with known risk factors for hepatitis C. Sexually transmitted infections (STIs) Get screened for STIs, including gonorrhea and chlamydia, if: You are sexually active and are younger than 58 years of age. You are older than 58 years of age and your health care provider tells you that you are at risk for this type of infection. Your sexual activity has changed since you were last screened, and you are at increased risk for chlamydia or gonorrhea. Ask your health care provider if  you are at risk. Ask your health care provider about whether you are at high risk for HIV. Your health care provider may recommend a prescription medicine to help prevent HIV infection. If you choose to take medicine to prevent HIV, you should first get tested for HIV. You should then be tested every 3 months for as long as you  are taking the medicine. Osteoporosis and menopause Osteoporosis is a disease in which the bones lose minerals and strength with aging. This can result in bone fractures. If you are 72 years old or older, or if you are at risk for osteoporosis and fractures, ask your health care provider if you should: Be screened for bone loss. Take a calcium  or vitamin D supplement to lower your risk of fractures. Be given hormone replacement therapy (HRT) to treat symptoms of menopause. Follow these instructions at home: Alcohol use Do not drink alcohol if: Your health care provider tells you not to drink. You are pregnant, may be pregnant, or are planning to become pregnant. If you drink alcohol: Limit how much you have to: 0-1 drink a day. Know how much alcohol is in your drink. In the [place] one drink equals one 12 oz bottle of beer (355 mL), one 5 oz glass of wine (148 mL), or one 1 oz glass of hard liquor (44 mL). Lifestyle Do not use any products that contain nicotine or tobacco. These products include cigarettes, chewing tobacco, and vaping devices, such as e-cigarettes. If you need help quitting, ask your health care provider. Do not use street drugs. Do not share needles. Ask your health care provider for help if you need support or information about quitting drugs. General instructions Schedule regular health, dental, and eye exams. Stay current with your vaccines. Tell your health care provider if: You often feel depressed. You have ever been abused or do not feel safe at home. Summary Adopting a healthy lifestyle and getting preventive care are important in promoting health and wellness. Follow your health care provider's instructions about healthy diet, exercising, and getting tested or screened for diseases. Follow your health care provider's instructions on monitoring your cholesterol and blood pressure. This information is not intended to replace advice given to you by your health  care provider. Make sure you discuss any questions you have with your health care provider. Document Revised: 07/12/2020 Document Reviewed: 07/12/2020 Elsevier Patient Education  2024 [employer].

## 2023-10-29 NOTE — Assessment & Plan Note (Signed)
 Cervical cancer screening performed according to ASCCP guidelines. Encouraged annual mammogram screening > past due, information for [facility] and mobile MMG provided Colonoscopy UTD DXA N/A Labs and immunizations with her primary Encouraged safe sexual practices as indicated Encouraged healthy lifestyle practices with diet and exercise For patients under 50-58yo, I recommend 1200mg  calcium daily and 600IU of vitamin D daily.

## 2023-11-22 ENCOUNTER — Other Ambulatory Visit: Payer: Self-pay | Admitting: Obstetrics and Gynecology

## 2023-11-22 DIAGNOSIS — Z79890 Hormone replacement therapy (postmenopausal): Secondary | ICD-10-CM

## 2023-11-22 NOTE — Telephone Encounter (Signed)
 Med refill request: estradiol  (vivelle -dot) 0.0375 mg/24hr patch Last AEX: 10/29/23 GH Next AEX: not yet scheduled Last MMG (if hormonal med) never Refill authorized: Last Rx sent #24 (84 day supply) with 3 refills on 10/29/23 GH. Please approve or deny

## 2023-12-03 MED ORDER — ESTRADIOL 0.0375 MG/24HR TD PTTW: 1.0000 | MEDICATED_PATCH | TRANSDERMAL | 0 refills | Status: AC

## 2023-12-03 NOTE — Telephone Encounter (Signed)
 Call returned to patient.  Patient states she incidentally threw away her 3 mo supply of estradiol  0.0375 mg patch. Requesting 3 mo supply to [facility]. Advised patient insurance may not cover, too early to fill. Discussed using GoodRX coupon if needed, patient agreeable. Advised I would send to Dr. Dallie to review, our office will f/u if any additional recommendations. Patient appreciative of call.   Routing to Dr. Dallie

## 2023-12-03 NOTE — Telephone Encounter (Signed)
 Patient notified of refill.

## 2023-12-03 NOTE — Addendum Note (Signed)
 Addended by: BRUTUS KATE SAILOR on: 12/03/2023 02:19 PM   Modules accepted: Orders

## 2023-12-04 ENCOUNTER — Other Ambulatory Visit: Payer: Self-pay | Admitting: Obstetrics and Gynecology

## 2023-12-06 NOTE — Telephone Encounter (Signed)
 Interface, Surescripts Out  [employer] Rx Refill An error occurred while processing the e-prescribing message.  The refill or change request for this medication was refused, but the message was not sent electronically to the requested pharmacy. Call in the refusal to the pharmacy.  Code: 601 - Receiver unable to process Prescription no longer active    See refill encounter dated 11/22/23. Call placed to patient to confirm she was able to pick up additional 90 day supply estradiol  0.0375 mg patch. Call with update, [phone], opt [phone].

## 2023-12-07 NOTE — Telephone Encounter (Signed)
 Pt called in stating that she wasn't able to pick up the medication because the pharmacy has been out due to it being on back order. She did say that she spoke to the pharmacy yesterday and the medication is suppose to come in today. Pt just wanted to make us  aware of this.

## 2023-12-10 ENCOUNTER — Other Ambulatory Visit (HOSPITAL_COMMUNITY): Payer: Self-pay

## 2023-12-10 ENCOUNTER — Other Ambulatory Visit: Payer: Self-pay

## 2023-12-10 DIAGNOSIS — Z79890 Hormone replacement therapy (postmenopausal): Secondary | ICD-10-CM

## 2023-12-10 MED ORDER — ESTRADIOL 0.0375 MG/24HR TD PTTW
1.0000 | MEDICATED_PATCH | TRANSDERMAL | 3 refills | Status: AC
  Filled 2023-12-10: qty 24, 84d supply, fill #0
  Filled 2023-12-11: qty 8, 28d supply, fill #0
  Filled 2023-12-12 – 2023-12-13 (×3): qty 24, 84d supply, fill #0
  Filled 2024-02-25 – 2024-03-11 (×3): qty 24, 84d supply, fill #1

## 2023-12-10 NOTE — Telephone Encounter (Signed)
 Patient called asking if her estradiol  patch could be sent to the  pharmacy. Called patient to verify she would like medication sent to [facility].

## 2023-12-11 ENCOUNTER — Other Ambulatory Visit (HOSPITAL_COMMUNITY): Payer: Self-pay

## 2023-12-12 ENCOUNTER — Other Ambulatory Visit (HOSPITAL_COMMUNITY): Payer: Self-pay

## 2023-12-12 ENCOUNTER — Telehealth (HOSPITAL_COMMUNITY): Payer: Self-pay | Admitting: Pharmacy Technician

## 2023-12-13 ENCOUNTER — Other Ambulatory Visit (HOSPITAL_BASED_OUTPATIENT_CLINIC_OR_DEPARTMENT_OTHER): Payer: Self-pay

## 2023-12-13 ENCOUNTER — Other Ambulatory Visit (HOSPITAL_COMMUNITY): Payer: Self-pay

## 2023-12-13 ENCOUNTER — Other Ambulatory Visit: Payer: Self-pay

## 2023-12-13 NOTE — Telephone Encounter (Signed)
 I attempted to do a prior auth but they came back as prior auth not needed, medication is covered

## 2023-12-14 ENCOUNTER — Other Ambulatory Visit (HOSPITAL_COMMUNITY): Payer: Self-pay

## 2023-12-14 ENCOUNTER — Other Ambulatory Visit (HOSPITAL_BASED_OUTPATIENT_CLINIC_OR_DEPARTMENT_OTHER): Payer: Self-pay

## 2024-01-07 ENCOUNTER — Encounter: Payer: Self-pay | Admitting: Radiology

## 2024-01-09 ENCOUNTER — Other Ambulatory Visit: Payer: Self-pay | Admitting: Obstetrics and Gynecology

## 2024-01-09 DIAGNOSIS — Z1231 Encounter for screening mammogram for malignant neoplasm of breast: Secondary | ICD-10-CM

## 2024-01-15 ENCOUNTER — Encounter: Admitting: Family Medicine

## 2024-01-22 ENCOUNTER — Telehealth: Payer: Self-pay

## 2024-01-22 NOTE — Telephone Encounter (Signed)
 Copied from CRM [id number]. Topic: Appointments - Transfer of Care >> Jan 22, 2024 12:28 PM Rea ORN wrote: Pt is requesting to transfer FROM: Dr. Jordan  Pt is requesting to transfer TO: [place] Comment Reason for requested transfer: Pt request It is the responsibility of the team the patient would like to transfer to [facility] to reach out to the patient if for any reason this transfer is not acceptable.

## 2024-01-22 NOTE — Telephone Encounter (Signed)
 Fine with me. Mary Jimenez

## 2024-01-30 ENCOUNTER — Ambulatory Visit
Admission: RE | Admit: 2024-01-30 | Discharge: 2024-01-30 | Disposition: A | Discharge status: Home / Self Care | Source: Ambulatory Visit | Attending: Obstetrics and Gynecology | Admitting: Obstetrics and Gynecology

## 2024-01-30 ENCOUNTER — Other Ambulatory Visit: Payer: Self-pay | Admitting: Obstetrics and Gynecology

## 2024-01-30 DIAGNOSIS — Z1231 Encounter for screening mammogram for malignant neoplasm of breast: Secondary | ICD-10-CM

## 2024-02-04 NOTE — Telephone Encounter (Signed)
TOC ok with me

## 2024-02-06 ENCOUNTER — Ambulatory Visit: Admitting: Family Medicine

## 2024-02-06 VITALS — BP 136/80 | HR 99 | Temp 97.9°F | Resp 16 | Ht 67.0 in | Wt 172.0 lb

## 2024-02-06 DIAGNOSIS — R790 Abnormal level of blood mineral: Secondary | ICD-10-CM

## 2024-02-06 DIAGNOSIS — Z130 Encounter for screening for diseases of the blood and blood-forming organs and certain disorders involving the immune mechanism: Secondary | ICD-10-CM

## 2024-02-06 DIAGNOSIS — Z13228 Encounter for screening for other metabolic disorders: Secondary | ICD-10-CM

## 2024-02-06 DIAGNOSIS — F419 Anxiety disorder, unspecified: Secondary | ICD-10-CM

## 2024-02-06 DIAGNOSIS — F332 Major depressive disorder, recurrent severe without psychotic features: Secondary | ICD-10-CM

## 2024-02-06 DIAGNOSIS — Z13220 Encounter for screening for lipoid disorders: Secondary | ICD-10-CM | POA: Diagnosis not present

## 2024-02-06 DIAGNOSIS — Z23 Encounter for immunization: Secondary | ICD-10-CM | POA: Diagnosis not present

## 2024-02-06 DIAGNOSIS — Z1329 Encounter for screening for other suspected endocrine disorder: Secondary | ICD-10-CM | POA: Diagnosis not present

## 2024-02-06 DIAGNOSIS — Z0000 Encounter for general adult medical examination without abnormal findings: Secondary | ICD-10-CM | POA: Diagnosis not present

## 2024-02-06 DIAGNOSIS — E538 Deficiency of other specified B group vitamins: Secondary | ICD-10-CM

## 2024-02-06 LAB — COMPREHENSIVE METABOLIC PANEL WITH GFR
ALT: 16 U/L (ref 0–35)
AST: 18 U/L (ref 0–37)
Albumin: 4.8 g/dL (ref 3.5–5.2)
Alkaline Phosphatase: 46 U/L (ref 39–117)
BUN: 13 mg/dL (ref 6–23)
CO2: 26 meq/L (ref 19–32)
Calcium: 9.4 mg/dL (ref 8.4–10.5)
Chloride: 103 meq/L (ref 96–112)
Creatinine, Ser: 0.97 mg/dL (ref 0.40–1.20)
GFR: 64.68 mL/min (ref >60.00)
Glucose, Bld: 91 mg/dL (ref 70–99)
Potassium: 3.6 meq/L (ref 3.5–5.1)
Sodium: 139 meq/L (ref 135–145)
Total Bilirubin: 0.7 mg/dL (ref 0.2–1.2)
Total Protein: 6.8 g/dL (ref 6.0–8.3)

## 2024-02-06 LAB — LIPID PANEL
Cholesterol: 176 mg/dL (ref 0–200)
HDL: 63.5 mg/dL (ref >39.00)
LDL Cholesterol: 104 mg/dL — ABNORMAL HIGH (ref 0–99)
NonHDL: 112.62
Total CHOL/HDL Ratio: 3
Triglycerides: 41 mg/dL (ref 0.0–149.0)
VLDL: 8.2 mg/dL (ref 0.0–40.0)

## 2024-02-06 LAB — CBC
HCT: 39.3 % (ref 36.0–46.0)
Hemoglobin: 13.4 g/dL (ref 12.0–15.0)
MCHC: 34.1 g/dL (ref 30.0–36.0)
MCV: 87.6 fl (ref 78.0–100.0)
Platelets: 218 K/uL (ref 150.0–400.0)
RBC: 4.49 Mil/uL (ref 3.87–5.11)
RDW: 13 % (ref 11.5–15.5)
WBC: 4.7 K/uL (ref 4.0–10.5)

## 2024-02-06 LAB — VITAMIN B12: Vitamin B-12: >1500 pg/mL — ABNORMAL HIGH (ref 211–911)

## 2024-02-06 LAB — FERRITIN: Ferritin: 38.2 ng/mL (ref 10.0–291.0)

## 2024-02-06 LAB — IRON: Iron: 108 ug/dL (ref 42–145)

## 2024-02-06 NOTE — Patient Instructions (Addendum)
 A few things to remember from today's visit:  Need for vaccination for viral hepatitis - Plan: Flu vaccine trivalent PF, 6mos and older(Flulaval,Afluria,Fluarix,Fluzone)  Routine general medical examination at a health care facility  B12 deficiency - Plan: Vitamin B12, CBC  Screening for lipoid disorders - Plan: Lipid panel  Screening for endocrine, metabolic and immunity disorder - Plan: Comprehensive metabolic panel with GFR  Anxiety disorder, unspecified type - Plan: Ambulatory referral to Psychiatry  Severe episode of recurrent major depressive disorder, without psychotic features (HCC) - Plan: Ambulatory referral to Psychiatry  Low ferritin level - Plan: CBC, Iron, Ferritin  Do not use My Chart to request refills or for acute issues that need immediate attention. If you send a my chart message, it may take a few days to be addressed, specially if I am not in the office.  Please be sure medication list is accurate. If a new problem present, please set up appointment sooner than planned today.  Health Maintenance, Female Adopting a healthy lifestyle and getting preventive care are important in promoting health and wellness. Ask your health care provider about: The right schedule for you to have regular tests and exams. Things you can do on your own to prevent diseases and keep yourself healthy. What should I know about diet, weight, and exercise? Eat a healthy diet  Eat a diet that includes plenty of vegetables, fruits, low-fat dairy products, and lean protein. Do not eat a lot of foods that are high in solid fats, added sugars, or sodium. Maintain a healthy weight Body mass index (BMI) is used to identify weight problems. It estimates body fat based on height and weight. Your health care provider can help determine your BMI and help you achieve or maintain a healthy weight. Get regular exercise Get regular exercise. This is one of the most important things you can do for your  health. Most adults should: Exercise for at least 150 minutes each week. The exercise should increase your heart rate and make you sweat (moderate-intensity exercise). Do strengthening exercises at least twice a week. This is in addition to the moderate-intensity exercise. Spend less time sitting. Even light physical activity can be beneficial. Watch cholesterol and blood lipids Have your blood tested for lipids and cholesterol at 58 years of age, then have this test every 5 years. Have your cholesterol levels checked more often if: Your lipid or cholesterol levels are high. You are older than 58 years of age. You are at high risk for heart disease. What should I know about cancer screening? Depending on your health history and family history, you may need to have cancer screening at various ages. This may include screening for: Breast cancer. Cervical cancer. Colorectal cancer. Skin cancer. Lung cancer. What should I know about heart disease, diabetes, and high blood pressure? Blood pressure and heart disease High blood pressure causes heart disease and increases the risk of stroke. This is more likely to develop in people who have high blood pressure readings or are overweight. Have your blood pressure checked: Every 3-5 years if you are 74-74 years of age. Every year if you are 36 years old or older. Diabetes Have regular diabetes screenings. This checks your fasting blood sugar level. Have the screening done: Once every three years after age 40 if you are at a normal weight and have a low risk for diabetes. More often and at a younger age if you are overweight or have a high risk for diabetes. What should I  know about preventing infection? Hepatitis B If you have a higher risk for hepatitis B, you should be screened for this virus. Talk with your health care provider to find out if you are at risk for hepatitis B infection. Hepatitis C Testing is recommended for: Everyone born  from [birth year] through 1965. Anyone with known risk factors for hepatitis C. Sexually transmitted infections (STIs) Get screened for STIs, including gonorrhea and chlamydia, if: You are sexually active and are younger than 58 years of age. You are older than 58 years of age and your health care provider tells you that you are at risk for this type of infection. Your sexual activity has changed since you were last screened, and you are at increased risk for chlamydia or gonorrhea. Ask your health care provider if you are at risk. Ask your health care provider about whether you are at high risk for HIV. Your health care provider may recommend a prescription medicine to help prevent HIV infection. If you choose to take medicine to prevent HIV, you should first get tested for HIV. You should then be tested every 3 months for as long as you are taking the medicine. Pregnancy If you are about to stop having your period (premenopausal) and you may become pregnant, seek counseling before you get pregnant. Take 400 to 800 micrograms (mcg) of folic acid every day if you become pregnant. Ask for birth control (contraception) if you want to prevent pregnancy. Osteoporosis and menopause Osteoporosis is a disease in which the bones lose minerals and strength with aging. This can result in bone fractures. If you are 34 years old or older, or if you are at risk for osteoporosis and fractures, ask your health care provider if you should: Be screened for bone loss. Take a calcium or vitamin D supplement to lower your risk of fractures. Be given hormone replacement therapy (HRT) to treat symptoms of menopause. Follow these instructions at home: Alcohol use Do not drink alcohol if: Your health care provider tells you not to drink. You are pregnant, may be pregnant, or are planning to become pregnant. If you drink alcohol: Limit how much you have to: 0-1 drink a day. Know how much alcohol is in your drink. In the  [place] one drink equals one 12 oz bottle of beer (355 mL), one 5 oz glass of wine (148 mL), or one 1 oz glass of hard liquor (44 mL). Lifestyle Do not use any products that contain nicotine or tobacco. These products include cigarettes, chewing tobacco, and vaping devices, such as e-cigarettes. If you need help quitting, ask your health care provider. Do not use street drugs. Do not share needles. Ask your health care provider for help if you need support or information about quitting drugs. General instructions Schedule regular health, dental, and eye exams. Stay current with your vaccines. Tell your health care provider if: You often feel depressed. You have ever been abused or do not feel safe at home. Summary Adopting a healthy lifestyle and getting preventive care are important in promoting health and wellness. Follow your health care provider's instructions about healthy diet, exercising, and getting tested or screened for diseases. Follow your health care provider's instructions on monitoring your cholesterol and blood pressure. This information is not intended to replace advice given to you by your health care provider. Make sure you discuss any questions you have with your health care provider. Document Revised: 07/12/2020 Document Reviewed: 07/12/2020 Elsevier Patient Education  2024 [employer].

## 2024-02-06 NOTE — Progress Notes (Signed)
 Chief Complaint  Patient presents with   Annual Exam    CPE   Discussed the use of AI scribe software for clinical note transcription with the patient, who gave verbal consent to proceed.  History of Present Illness Mary Jimenez is a 58 year old female with PMHx significant for anxiety,depression, and allergic rhinitis here today for her routine physical.  Last CPE: 05/15/22  She follows with gynecology regularly.  She underwent a dilation and curettage due to abnormal vaginal bleeding, 08/24/2023, with normal surgical pathology results.   She exercises regularly, engaging in strength training at the gym about three times a week, reduced from five times a week due to recent life events.  She follows a vegan diet, ensuring adequate protein intake through lentils, beans, tofu, and tempeh, estimating her daily protein intake to be at least 60 grams. She consumes vegetables daily and gets calcium from fortified soy milk and tofu.  She sleeps an average of eight hours per night and consumes alcohol infrequently, about two glasses every two to three months.  She does not smoke. She follows with her eye care provider and dentist regularly.  She mentions that she has undergone a biological age test, which indicated her kidney age was higher than expected, though her kidney function has been normal in previous tests.No hx of diabetes or hypertension.  Immunization History  Administered Date(s) Administered   Hepatitis A, Adult 03/18/2014, 12/30/2014   Hepatitis B, ADULT 12/30/2014, 02/11/2015, 03/23/2016   Influenza,inj,Quad PF,6+ Mos 03/18/2014, 12/30/2014, 03/23/2016   JAPANESE ENCEPHALITIS IM 12/30/2014, 02/11/2015   Tdap 03/18/2014   Typhoid Inactivated 03/18/2014   Zoster Recombinant(Shingrix ) 05/15/2022, 08/02/2022   Health Maintenance  Topic Date Due   HIV Screening  Never done   Pneumococcal Vaccine: 50+ Years (1 of 2 - PCV) Never done   Mammogram  10/03/2019   Influenza  Vaccine  10/05/2023   COVID-19 Vaccine (1 - 2025-26 season) 03/03/2024 (Originally 11/05/2023)   DTaP/Tdap/Td (2 - Td or Tdap) 03/18/2024   Cervical Cancer Screening (HPV/Pap Cotest)  05/13/2026   Colonoscopy  07/21/2027   Hepatitis B Vaccines 19-59 Average Risk  Completed   Hepatitis C Screening  Completed   Zoster Vaccines- Shingrix   Completed   HPV VACCINES  Aged Out   Meningococcal B Vaccine  Aged Out   Anxiety and depression: Her gynecologist has been prescribing bupropion  for depression and ADHD. She has stopped taking buspirone , which was recommended for anxiety.  She has propranolol  available for social anxiety but uses it rarely.  She has concerns about ADHD symptoms, trouble with concentration.  Initially Wellbutrin  helped but is no longer doing so. She also reports that recently she was diagnosed with autism disorder. She follows with psychotherapist regularly.  She has not seen psychiatrist before.    02/06/2024    1:06 PM 10/29/2023    4:35 PM 10/29/2023    3:36 PM 10/10/2022    3:45 PM 05/15/2022    3:58 PM  Depression screen PHQ 2/9  Decreased Interest 1 0 0 0 1  Down, Depressed, Hopeless 1 1 1  0 1  PHQ - 2 Score 2 1 1  0 2  Altered sleeping 1 1  1 1   Tired, decreased energy 0 2  1 1   Change in appetite 0 2  0 0  Feeling bad or failure about yourself  2 1  0 1  Trouble concentrating 1 1  1 1   Moving slowly or fidgety/restless 0 0  0 0  Suicidal thoughts 0 0  0 0  PHQ-9 Score 6 8   3  6    Difficult doing work/chores Somewhat difficult Somewhat difficult  Somewhat difficult Somewhat difficult     Data saved with a previous flowsheet row definition      02/06/2024    1:07 PM 10/10/2022    3:45 PM 05/15/2022    3:58 PM  GAD 7 : Generalized Anxiety Score  Nervous, Anxious, on Edge 1 0 1  Control/stop worrying 1 0 1  Worry too much - different things 1 0 1  Trouble relaxing 1 0 0  Restless 1 1 0  Easily annoyed or irritable 0 0 0  Afraid - awful might happen 1 0 1   Total GAD 7 Score 6 1 4   Anxiety Difficulty Somewhat difficult Not difficult at all Very difficult   B12 deficiency: She takes vitamin B12 regularly and an omega-3 supplement with vitamin D and K2.  She recalls having low ferritin levels in the past, though her iron levels have been normal. She recalls her ferritin being low in the past but is unsure of recent levels.  Lab Results  Component Value Date   VITAMINB12 647 05/15/2022   Review of Systems  Constitutional:  Negative for activity change, appetite change, chills and fever.  HENT:  Negative for mouth sores, sore throat and trouble swallowing.   Eyes:  Negative for redness and visual disturbance.  Respiratory:  Negative for cough, shortness of breath and wheezing.   Cardiovascular:  Negative for chest pain and leg swelling.  Gastrointestinal:  Negative for abdominal pain, nausea and vomiting.  Endocrine: Negative for cold intolerance, heat intolerance, polydipsia, polyphagia and polyuria.  Genitourinary:  Negative for decreased urine volume, dysuria and hematuria.  Musculoskeletal:  Negative for gait problem and myalgias.  Skin:  Negative for color change and rash.  Allergic/Immunologic: Positive for environmental allergies.  Neurological:  Negative for syncope, weakness and headaches.  Hematological:  Negative for adenopathy. Does not bruise/bleed easily.  Psychiatric/Behavioral:  Negative for confusion and hallucinations. The patient is nervous/anxious.   All other systems reviewed and are negative.  Current Outpatient Medications on File Prior to Visit  Medication Sig Dispense Refill   albuterol  (VENTOLIN  HFA) 108 (90 Base) MCG/ACT inhaler Inhale into the lungs.     buPROPion  (WELLBUTRIN  XL) 300 MG 24 hr tablet Take 1 tablet (300 mg total) by mouth daily. 90 tablet 3   busPIRone  (BUSPAR ) 5 MG tablet Take 1 tablet (5 mg total) by mouth 2 (two) times daily. 180 tablet 3   cetirizine (ZYRTEC) 10 MG tablet Take 10 mg by mouth  daily.     cyanocobalamin  (VITAMIN B12) 500 MCG tablet Take by mouth.     estradiol  (ESTRACE ) 0.1 MG/GM vaginal cream Apply 0.5g to vulva twice a week. Do not use applicator. 42.5 g 1   estradiol  (VIVELLE -DOT) 0.0375 MG/24HR Place 1 patch onto the skin 2 (two) times a week. 12 patch 0   estradiol  (VIVELLE -DOT) 0.0375 MG/24HR Place 1 patch onto the skin 2 (two) times a week. 24 patch 3   Multiple Vitamin (MULTIVITAMIN) capsule Take 1 capsule by mouth daily.     N-ACETYL CYSTEINE PO Take by mouth.     Omega-3 Fatty Acids (FISH OIL OMEGA-3 PO) Take by mouth daily.     progesterone  (PROMETRIUM ) 200 MG capsule Take 1 capsule (200 mg total) by mouth daily. 90 capsule 3   propranolol  (INDERAL ) 10 MG tablet Take 1 tablet (10 mg  total) by mouth 2 (two) times daily as needed. 60 tablet 0   triamcinolone  cream (KENALOG ) 0.1 % Apply 1 application  topically 2 (two) times daily as needed. 30 g 1   No current facility-administered medications on file prior to visit.   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 type   Uterine cancer Maternal Aunt    Social History   Socioeconomic History   Marital status: Legally Separated    Spouse name: Not on file   Number of children: 2   Years of education: Not on file   Highest education level: Some college, no degree  Occupational History   Not on file  Tobacco Use   Smoking status: Former    Current packs/day: 0.00    Average packs/day: 1 pack/day for 7.0 years (7.0 ttl pk-yrs)    Types: Cigarettes    Start date: 64    Quit date: 1995    Years since quitting: 30.9   Smokeless tobacco: Never   Tobacco comments:    smoked off and on  Vaping Use   Vaping status: Never Used  Substance and Sexual Activity   Alcohol use: Not Currently    Comment: occasional beer   Drug use: Never   Sexual activity: Not Currently    Birth control/protection: Post-menopausal    Comment: 1st intercourse 58 yo-Fewer than 5 partners  Other Topics Concern   Not on file  Social History Narrative   Not on file   Social Drivers of Health   Financial Resource Strain: High Risk (02/06/2024)   Overall Financial Resource Strain (CARDIA)    Difficulty of Paying Living Expenses: Hard  Food Insecurity: Food Insecurity Present (02/06/2024)   Hunger Vital Sign    Worried About Running Out of Food in the Last Year: Sometimes true    Ran Out of Food in the Last Year: Sometimes true  Transportation Needs: No Transportation Needs (02/06/2024)   PRAPARE - Administrator, Civil Service (Medical): No    Lack of Transportation (Non-Medical): No  Physical Activity: Sufficiently Active (02/06/2024)   Exercise Vital Sign    Days of Exercise per Week: 3 days  Minutes of Exercise per Session: 50 min  Stress: No Stress Concern Present (02/06/2024)   [employer] of  Occupational Health - Occupational Stress Questionnaire    Feeling of Stress: Only a little  Social Connections: Socially Isolated (02/06/2024)   Social Connection and Isolation Panel    Frequency of Communication with Friends and Family: More than three times a week    Frequency of Social Gatherings with Friends and Family: More than three times a week    Attends Religious Services: Never    Database Administrator or Organizations: No    Attends Banker Meetings: Not on file    Marital Status: Separated   Vitals:   02/06/24 1301  BP: 136/80  Pulse: 99  Resp: 16  Temp: 97.9 F (36.6 C)  SpO2: 99%   Body mass index is 26.94 kg/m.  Wt Readings from Last 3 Encounters:  02/06/24 172 lb (78 kg)  10/29/23 174 lb (78.9 kg)  09/06/23 170 lb (77.1 kg)   Physical Exam Vitals and nursing note reviewed.  Constitutional:      General: She is not in acute distress.    Appearance: She is well-developed.  HENT:     Head: Normocephalic and atraumatic.     Right Ear: Hearing, tympanic membrane, ear canal and external ear normal.     Left Ear: Hearing, tympanic membrane, ear canal and external ear normal.     Mouth/Throat:     Mouth: Mucous membranes are moist.     Pharynx: Oropharynx is clear. Uvula midline.  Eyes:     Extraocular Movements: Extraocular movements intact.     Conjunctiva/sclera: Conjunctivae normal.     Pupils: Pupils are equal, round, and reactive to light.  Neck:     Thyroid : No thyroid  mass or thyromegaly.  Cardiovascular:     Rate and Rhythm: Normal rate and regular rhythm.     Pulses:          Dorsalis pedis pulses are 2+ on the right side and 2+ on the left side.     Heart sounds: No murmur heard. Pulmonary:     Effort: Pulmonary effort is normal. No respiratory distress.     Breath sounds: Normal breath sounds.  Abdominal:     Palpations: Abdomen is soft. There is no hepatomegaly or mass.     Tenderness: There is no abdominal tenderness.   Genitourinary:    Comments: Deferred to gyn. Musculoskeletal:     Left lower leg: No edema.     Comments: No major deformity or signs of synovitis appreciated.  Lymphadenopathy:     Cervical: No cervical adenopathy.     Upper Body:     Right upper body: No supraclavicular adenopathy.     Left upper body: No supraclavicular adenopathy.  Skin:    General: Skin is warm.     Findings: No erythema or rash.  Neurological:     General: No focal deficit present.     Mental Status: She is alert and oriented to person, place, and time.     Cranial Nerves: No cranial nerve deficit.     Coordination: Coordination normal.     Gait: Gait normal.     Deep Tendon Reflexes:     Reflex Scores:      Bicep reflexes are 2+ on the right side and 2+ on the left side.      Patellar reflexes are 2+ on the right side and 2+ on the left side. Psychiatric:  Mood and Affect: Mood and affect normal.   ASSESSMENT AND PLAN: [name] was here today annual physical examination.  Orders Placed This Encounter  Procedures   Flu vaccine trivalent PF, 6mos and older(Flulaval,Afluria,Fluarix,Fluzone)   Comprehensive metabolic panel with GFR   Vitamin B12   Lipid panel   CBC   Iron   Ferritin   Ambulatory referral to Psychiatry   Lab Results  Component Value Date   CHOL 176 02/06/2024   HDL 63.50 02/06/2024   LDLCALC 104 (H) 02/06/2024   TRIG 41.0 02/06/2024   CHOLHDL 3 02/06/2024   Lab Results  Component Value Date   ALT 16 02/06/2024   AST 18 02/06/2024   ALKPHOS 46 02/06/2024   BILITOT 0.7 02/06/2024   Lab Results  Component Value Date   NA 139 02/06/2024   CL 103 02/06/2024   K 3.6 02/06/2024   CO2 26 02/06/2024   BUN 13 02/06/2024   CREATININE 0.97 02/06/2024   GFR 64.68 02/06/2024   CALCIUM 9.4 02/06/2024   ALBUMIN 4.8 02/06/2024   GLUCOSE 91 02/06/2024   Lab Results  Component Value Date   WBC 4.7 02/06/2024   HGB 13.4 02/06/2024   HCT 39.3 02/06/2024   MCV  87.6 02/06/2024   PLT 218.0 02/06/2024   Lab Results  Component Value Date   VITAMINB12 >1500 (H) 02/06/2024   Routine general medical examination at a health care facility Assessment & Plan: We discussed the importance of regular physical activity and healthy diet for prevention of chronic illness and/or complications. Preventive guidelines reviewed. Vaccination updated. Continue her female preventive care with her gynecologist. Ca++ and vit D supplementation to continue. Next CPE in a year.   B12 deficiency Assessment & Plan: Continue current dose of B12 supplementation.  Orders: -     Vitamin B12; Future -     CBC; Future  Screening for lipoid disorders -     Lipid panel; Future  Screening for endocrine, metabolic and immunity disorder -     Comprehensive metabolic panel with GFR; Future  Anxiety disorder, unspecified type Assessment & Plan: Stopped Buspar , which was prescribed by her gynecologist. Psychiatry referral placed.  Orders: -     Ambulatory referral to Psychiatry  Severe episode of recurrent major depressive disorder, without psychotic features (HCC) Assessment & Plan: On Wellbutrin  XL 300 mg daily. She has concerns about ADHD symptoms. Appt with psychiatrist will be arranged.  Orders: -     Ambulatory referral to Psychiatry  Low ferritin level -     CBC; Future -     Iron; Future -     Ferritin; Future  Need for vaccination -     Pneumococcal conjugate vaccine 20-valent  Need for vaccination for viral hepatitis -     Flu vaccine trivalent PF, 6mos and older(Flulaval,Afluria,Fluarix,Fluzone)  Return in 1 year (on 02/05/2025) for CPE.  Ruddy Swire G. Karrissa Parchment, MD  [facility]. [place] office.

## 2024-02-07 NOTE — Assessment & Plan Note (Signed)
 We discussed the importance of regular physical activity and healthy diet for prevention of chronic illness and/or complications. Preventive guidelines reviewed. Vaccination updated. Continue her female preventive care with her gynecologist. Ca++ and vit D supplementation to continue. Next CPE in a year.

## 2024-02-07 NOTE — Assessment & Plan Note (Signed)
 On Wellbutrin  XL 300 mg daily. She has concerns about ADHD symptoms. Appt with psychiatrist will be arranged.

## 2024-02-07 NOTE — Assessment & Plan Note (Signed)
Continue current dose of B12 supplementation.

## 2024-02-07 NOTE — Assessment & Plan Note (Signed)
 Stopped Buspar , which was prescribed by her gynecologist. Psychiatry referral placed.

## 2024-02-08 ENCOUNTER — Ambulatory Visit: Payer: Self-pay | Admitting: Family Medicine

## 2024-02-22 ENCOUNTER — Ambulatory Visit: Payer: Self-pay | Admitting: Obstetrics and Gynecology

## 2024-02-25 ENCOUNTER — Other Ambulatory Visit: Payer: Self-pay

## 2024-02-25 ENCOUNTER — Other Ambulatory Visit (HOSPITAL_COMMUNITY): Payer: Self-pay

## 2024-03-07 ENCOUNTER — Other Ambulatory Visit (HOSPITAL_COMMUNITY): Payer: Self-pay

## 2024-03-10 ENCOUNTER — Other Ambulatory Visit: Payer: Self-pay

## 2024-03-10 ENCOUNTER — Other Ambulatory Visit (HOSPITAL_BASED_OUTPATIENT_CLINIC_OR_DEPARTMENT_OTHER): Payer: Self-pay

## 2024-03-11 ENCOUNTER — Other Ambulatory Visit (HOSPITAL_BASED_OUTPATIENT_CLINIC_OR_DEPARTMENT_OTHER): Payer: Self-pay

## 2024-03-28 ENCOUNTER — Encounter: Admitting: Family
